# Patient Record
Sex: Female | Born: 1985 | Race: White | Hispanic: No | Marital: Married | Smoking: Former smoker
Health system: Southern US, Community
[De-identification: ages and names within clinical notes are randomized; demographics above are authoritative.]

## PROBLEM LIST (undated history)

## (undated) ENCOUNTER — Emergency Department (HOSPITAL_COMMUNITY): Payer: 59

## (undated) DIAGNOSIS — E669 Obesity, unspecified: Secondary | ICD-10-CM

## (undated) DIAGNOSIS — J45909 Unspecified asthma, uncomplicated: Secondary | ICD-10-CM

## (undated) DIAGNOSIS — O139 Gestational [pregnancy-induced] hypertension without significant proteinuria, unspecified trimester: Secondary | ICD-10-CM

## (undated) DIAGNOSIS — Z8759 Personal history of other complications of pregnancy, childbirth and the puerperium: Secondary | ICD-10-CM

## (undated) DIAGNOSIS — T4145XA Adverse effect of unspecified anesthetic, initial encounter: Secondary | ICD-10-CM

## (undated) DIAGNOSIS — T8859XA Other complications of anesthesia, initial encounter: Secondary | ICD-10-CM

## (undated) HISTORY — DX: Gestational (pregnancy-induced) hypertension without significant proteinuria, unspecified trimester: O13.9

## (undated) HISTORY — PX: BREAST REDUCTION SURGERY: SHX8

## (undated) HISTORY — DX: Personal history of other complications of pregnancy, childbirth and the puerperium: Z87.59

---
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
## 2018-02-01 NOTE — L&D Delivery Note (Addendum)
Delivery Note Pt reached complete dilation and pushed great. At 12:26 PM a viable female was delivered via Vaginal, Spontaneous (Presentation: OA ).  APGAR: 7, 8; weight 4 lb 13.1 oz (2186 g).   Placenta status: delivered spontaneously .  Cord:  with the following complications: nuchal x 1.   Anesthesia:  epidural Episiotomy: None Lacerations: 1st degree Suture Repair: 3.0 vicryl rapide Est. Blood Loss (mL):  337mL  Mom to postpartum.  Baby to Couplet care / Skin to Skin.  Plan to continue magnesium and labetalol.  Repeat labs with epidural removal.  I's and O's  Sara Wolf 08/15/2018, 1:08 PM

## 2018-03-20 LAB — OB RESULTS CONSOLE RUBELLA ANTIBODY, IGM: Rubella: NON-IMMUNE/NOT IMMUNE

## 2018-03-20 LAB — OB RESULTS CONSOLE HEPATITIS B SURFACE ANTIGEN: Hepatitis B Surface Ag: NEGATIVE

## 2018-03-20 LAB — OB RESULTS CONSOLE GC/CHLAMYDIA
Chlamydia: NEGATIVE
Gonorrhea: NEGATIVE

## 2018-03-20 LAB — OB RESULTS CONSOLE RPR: RPR: NONREACTIVE

## 2018-03-20 LAB — OB RESULTS CONSOLE HIV ANTIBODY (ROUTINE TESTING): HIV: NONREACTIVE

## 2018-06-02 ENCOUNTER — Encounter (HOSPITAL_COMMUNITY): Payer: Self-pay | Admitting: Obstetrics and Gynecology

## 2018-06-09 ENCOUNTER — Other Ambulatory Visit (HOSPITAL_COMMUNITY): Payer: Self-pay | Admitting: Obstetrics and Gynecology

## 2018-06-09 DIAGNOSIS — Z369 Encounter for antenatal screening, unspecified: Secondary | ICD-10-CM

## 2018-06-12 ENCOUNTER — Encounter (HOSPITAL_COMMUNITY): Payer: Self-pay | Admitting: *Deleted

## 2018-06-15 ENCOUNTER — Other Ambulatory Visit: Payer: Self-pay

## 2018-06-15 ENCOUNTER — Other Ambulatory Visit (HOSPITAL_COMMUNITY): Payer: Self-pay | Admitting: Obstetrics and Gynecology

## 2018-06-15 ENCOUNTER — Ambulatory Visit (HOSPITAL_COMMUNITY)
Admission: RE | Admit: 2018-06-15 | Discharge: 2018-06-15 | Disposition: A | Discharge status: Home / Self Care | Payer: 59 | Source: Ambulatory Visit | Attending: Obstetrics and Gynecology | Admitting: Obstetrics and Gynecology

## 2018-06-15 DIAGNOSIS — Z363 Encounter for antenatal screening for malformations: Secondary | ICD-10-CM

## 2018-06-15 DIAGNOSIS — O99212 Obesity complicating pregnancy, second trimester: Secondary | ICD-10-CM

## 2018-06-15 DIAGNOSIS — Z369 Encounter for antenatal screening, unspecified: Secondary | ICD-10-CM

## 2018-06-15 DIAGNOSIS — Z3A26 26 weeks gestation of pregnancy: Secondary | ICD-10-CM | POA: Diagnosis not present

## 2018-06-15 HISTORY — DX: Unspecified asthma, uncomplicated: J45.909

## 2018-06-15 HISTORY — DX: Obesity, unspecified: E66.9

## 2018-08-03 ENCOUNTER — Other Ambulatory Visit: Payer: Self-pay

## 2018-08-03 ENCOUNTER — Inpatient Hospital Stay (HOSPITAL_COMMUNITY)
Admission: AD | Admit: 2018-08-03 | Discharge: 2018-08-03 | Disposition: A | Discharge status: Home / Self Care | Payer: 59 | Attending: Obstetrics and Gynecology | Admitting: Obstetrics and Gynecology

## 2018-08-03 ENCOUNTER — Encounter (HOSPITAL_COMMUNITY): Payer: Self-pay

## 2018-08-03 DIAGNOSIS — O10013 Pre-existing essential hypertension complicating pregnancy, third trimester: Secondary | ICD-10-CM | POA: Diagnosis not present

## 2018-08-03 DIAGNOSIS — Z3A33 33 weeks gestation of pregnancy: Secondary | ICD-10-CM

## 2018-08-03 DIAGNOSIS — O163 Unspecified maternal hypertension, third trimester: Secondary | ICD-10-CM | POA: Diagnosis not present

## 2018-08-03 DIAGNOSIS — Z3689 Encounter for other specified antenatal screening: Secondary | ICD-10-CM | POA: Diagnosis not present

## 2018-08-03 DIAGNOSIS — R030 Elevated blood-pressure reading, without diagnosis of hypertension: Secondary | ICD-10-CM | POA: Diagnosis present

## 2018-08-03 LAB — PROTEIN / CREATININE RATIO, URINE
Creatinine, Urine: 200.93 mg/dL
Protein Creatinine Ratio: 0.1 mg/mg{Cre} (ref 0.00–0.15)
Total Protein, Urine: 21 mg/dL

## 2018-08-03 LAB — URINALYSIS, ROUTINE W REFLEX MICROSCOPIC
Bilirubin Urine: NEGATIVE
Glucose, UA: NEGATIVE mg/dL
Hgb urine dipstick: NEGATIVE
Ketones, ur: NEGATIVE mg/dL
Leukocytes,Ua: NEGATIVE
Nitrite: NEGATIVE
Protein, ur: NEGATIVE mg/dL
Specific Gravity, Urine: 1.023 (ref 1.005–1.030)
pH: 6 (ref 5.0–8.0)

## 2018-08-03 LAB — COMPREHENSIVE METABOLIC PANEL
ALT: 13 U/L (ref 0–44)
AST: 18 U/L (ref 15–41)
Albumin: 2.7 g/dL — ABNORMAL LOW (ref 3.5–5.0)
Alkaline Phosphatase: 84 U/L (ref 38–126)
Anion gap: 10 (ref 5–15)
BUN: 10 mg/dL (ref 6–20)
CO2: 21 mmol/L — ABNORMAL LOW (ref 22–32)
Calcium: 9.5 mg/dL (ref 8.9–10.3)
Chloride: 106 mmol/L (ref 98–111)
Creatinine, Ser: 0.77 mg/dL (ref 0.44–1.00)
GFR calc Af Amer: >60 mL/min (ref >60)
GFR calc non Af Amer: >60 mL/min (ref >60)
Glucose, Bld: 80 mg/dL (ref 70–99)
Potassium: 3.9 mmol/L (ref 3.5–5.1)
Sodium: 137 mmol/L (ref 135–145)
Total Bilirubin: 0.4 mg/dL (ref 0.3–1.2)
Total Protein: 6 g/dL — ABNORMAL LOW (ref 6.5–8.1)

## 2018-08-03 LAB — CBC
HCT: 36.6 % (ref 36.0–46.0)
Hemoglobin: 12.4 g/dL (ref 12.0–15.0)
MCH: 28.6 pg (ref 26.0–34.0)
MCHC: 33.9 g/dL (ref 30.0–36.0)
MCV: 84.5 fL (ref 80.0–100.0)
Platelets: 239 10*3/uL (ref 150–400)
RBC: 4.33 MIL/uL (ref 3.87–5.11)
RDW: 13.1 % (ref 11.5–15.5)
WBC: 8.4 10*3/uL (ref 4.0–10.5)
nRBC: 0 % (ref 0.0–0.2)

## 2018-08-03 MED ORDER — BETAMETHASONE SOD PHOS & ACET 6 (3-3) MG/ML IJ SUSP
12.0000 mg | INTRAMUSCULAR | Status: DC
  Administered 2018-08-03: 12 mg via INTRAMUSCULAR
  Filled 2018-08-03: qty 2

## 2018-08-03 NOTE — MAU Note (Signed)
Pt sent from MD office for BP evaluation.  Denies H/A, visual disturbances, or epigastric pain.  Endorses +FM.  Denies VB or LOF. 

## 2018-08-03 NOTE — MAU Provider Note (Signed)
History     CSN: [id number]  Arrival date and time: 08/03/18 1138  G1 @33 .6 wks sent from office for elevated BPs today. Denies HA, visual disturbances, RUQ pain, SOB, and CP. Her pregnancy is complicated by obesity, asthma, and possible preexisting HTN.    OB History    Gravida  1   Para      Term      Preterm      AB      Living  0     SAB      TAB      Ectopic      Multiple      Live Births              Past Medical History:  Diagnosis Date  . Asthma   . Obesity (BMI 30-39.9)     Past Surgical History:  Procedure Laterality Date  . BREAST REDUCTION SURGERY      History reviewed. No pertinent family history.  Social History   Tobacco Use  . Smoking status: Never Smoker  . Smokeless tobacco: Never Used  Substance Use Topics  . Alcohol use: Not Currently  . Drug use: Never    Allergies: No Known Allergies  No medications prior to admission.    Review of Systems  Eyes: Negative for visual disturbance.  Respiratory: Negative for shortness of breath.   Cardiovascular: Positive for leg swelling (hans and feet). Negative for chest pain.  Gastrointestinal: Negative for abdominal pain.  Genitourinary: Negative for vaginal bleeding.  Neurological: Negative for headaches.   Physical Exam   Blood pressure (!) 144/91, pulse 81, temperature 98.4 F (36.9 C), temperature source Oral, resp. rate 18, height 5\' 2"  (1.575 m), weight 108.4 kg, last menstrual period 12/02/2017, SpO2 100 %. Patient Vitals for the past 24 hrs:  BP Temp Temp src Pulse Resp SpO2 Height Weight  08/03/18 1433 (!) 144/91 98.4 F (36.9 C) Oral 81 18 100 % - -  08/03/18 1401 (!) 141/86 - - 81 - - - -  08/03/18 1346 (!) 141/87 - - 81 - - - -  08/03/18 1335 - - - - - 98 % - -  08/03/18 1331 139/79 - - 86 - - - -  08/03/18 1300 135/79 - - 86 - 99 % - -  08/03/18 1252 (!) 144/94 - - 96 - - - -  08/03/18 1222 136/84 - - 89 - - - -  08/03/18 1214 (!) 141/90 98.9 F (37.2 C)  Oral 96 16 100 % 5\' 2"  (1.575 m) 108.4 kg   Physical Exam  Nursing note and vitals reviewed. Constitutional: She is oriented to person, place, and time. She appears well-developed and well-nourished. No distress.  HENT:  Head: Normocephalic and atraumatic.  Neck: Normal range of motion.  Respiratory: Effort normal. No respiratory distress.  Musculoskeletal: Normal range of motion.        General: No edema.  Neurological: She is alert and oriented to person, place, and time.  Skin: Skin is warm and dry.  Psychiatric: She has a normal mood and affect.  EFM: 135 bpm, mod variability, + accels, no decels Toco: none  Results for orders placed or performed during the hospital encounter of 08/03/18 (from the past 24 hour(s))  Comprehensive metabolic panel     Status: Abnormal   Collection Time: 08/03/18 12:37 PM  Result Value Ref Range   Sodium 137 135 - 145 mmol/L   Potassium 3.9 3.5 - 5.1 mmol/L  Chloride 106 98 - 111 mmol/L   CO2 21 (L) 22 - 32 mmol/L   Glucose, Bld 80 70 - 99 mg/dL   BUN 10 6 - 20 mg/dL   Creatinine, Ser 0.77 0.44 - 1.00 mg/dL   Calcium 9.5 8.9 - 10.3 mg/dL   Total Protein 6.0 (L) 6.5 - 8.1 g/dL   Albumin 2.7 (L) 3.5 - 5.0 g/dL   AST 18 15 - 41 U/L   ALT 13 0 - 44 U/L   Alkaline Phosphatase 84 38 - 126 U/L   Total Bilirubin 0.4 0.3 - 1.2 mg/dL   GFR calc non Af Amer >60 >60 mL/min   GFR calc Af Amer >60 >60 mL/min   Anion gap 10 5 - 15  CBC     Status: None   Collection Time: 08/03/18 12:37 PM  Result Value Ref Range   WBC 8.4 4.0 - 10.5 K/uL   RBC 4.33 3.87 - 5.11 MIL/uL   Hemoglobin 12.4 12.0 - 15.0 g/dL   HCT 36.6 36.0 - 46.0 %   MCV 84.5 80.0 - 100.0 fL   MCH 28.6 26.0 - 34.0 pg   MCHC 33.9 30.0 - 36.0 g/dL   RDW 13.1 11.5 - 15.5 %   Platelets 239 150 - 400 K/uL   nRBC 0.0 0.0 - 0.2 %  Protein / creatinine ratio, urine     Status: None   Collection Time: 08/03/18  1:05 PM  Result Value Ref Range   Creatinine, Urine 200.93 mg/dL   Total  Protein, Urine 21 mg/dL   Protein Creatinine Ratio 0.10 0.00 - 0.15 mg/mg[Cre]  Urinalysis, Routine w reflex microscopic     Status: Abnormal   Collection Time: 08/03/18  1:05 PM  Result Value Ref Range   Color, Urine YELLOW YELLOW   APPearance HAZY (A) CLEAR   Specific Gravity, Urine 1.023 1.005 - 1.030   pH 6.0 5.0 - 8.0   Glucose, UA NEGATIVE NEGATIVE mg/dL   Hgb urine dipstick NEGATIVE NEGATIVE   Bilirubin Urine NEGATIVE NEGATIVE   Ketones, ur NEGATIVE NEGATIVE mg/dL   Protein, ur NEGATIVE NEGATIVE mg/dL   Nitrite NEGATIVE NEGATIVE   Leukocytes,Ua NEGATIVE NEGATIVE   MAU Course  Procedures  MDM Labs ordered and reviewed. Consult with Dr. Marvel Plan (per her request), no evidence of PEC or severe BPs, reviewed BPs and lab results, plan for BMZ #2 tomorrow in MAU and f/u in office next week. Stable for discharge home.  Assessment and Plan   1. [redacted] weeks gestation of pregnancy   2. NST (non-stress test) reactive   3. Hypertension affecting pregnancy in third trimester    Discharge home Follow up at [facility] next week PEC precautions  Allergies as of 08/03/2018   No Known Allergies     Medication List    TAKE these medications   Fluticasone-Salmeterol 250-50 MCG/DOSE Aepb Commonly known as: ADVAIR Inhale 1 puff into the lungs 2 (two) times daily.   prenatal multivitamin Tabs tablet Take 1 tablet by mouth daily at 12 noon.   PRESCRIPTION MEDICATION "rescue inhaler " if needed.      Julianne Handler, CNM 08/03/2018, 3:44 PM

## 2018-08-03 NOTE — Discharge Instructions (Signed)
Hypertension During Pregnancy °Hypertension is also called high blood pressure. High blood pressure means that the force of your blood moving in your body is too strong. It can cause problems for you and your baby. Different types of high blood pressure can happen during pregnancy. The types are: °· High blood pressure before you got pregnant. This is called chronic hypertension.  This can continue during your pregnancy. Your doctor will want to keep checking your blood pressure. You may need medicine to keep your blood pressure under control while you are pregnant. You will need follow-up visits after you have your baby. °· High blood pressure that goes up during pregnancy when it was normal before. This is called gestational hypertension. It will usually get better after you have your baby, but your doctor will need to watch your blood pressure to make sure that it is getting better. °· Very high blood pressure during pregnancy. This is called preeclampsia. Very high blood pressure is an emergency that needs to be checked and treated right away. °· You may develop very high blood pressure after giving birth. This is called postpartum preeclampsia. This usually occurs within 48 hours after childbirth but may occur up to 6 weeks after giving birth. This is rare. °How does this affect me? °If you have high blood pressure during pregnancy, you have a higher chance of developing high blood pressure: °· As you get older. °· If you get pregnant again. °In some cases, high blood pressure during pregnancy can cause: °· Stroke. °· Heart attack. °· Damage to the kidneys, lungs, or liver. °· Preeclampsia. °· Jerky movements you cannot control (convulsions or seizures). °· Problems with the placenta. °How does this affect my baby? °Your baby may: °· Be born early. °· Not weigh as much as he or she should. °· Not handle labor well, leading to a c-section birth. °What are the risks? °· Having high blood pressure during a past  pregnancy. °· Being overweight. °· Being 35 years old or older. °· Being pregnant for the first time. °· Being pregnant with more than one baby. °· Becoming pregnant using fertility methods, such as IVF. °· Having other problems, such as diabetes, or kidney disease. °· Having family members who have high blood pressure. °What can I do to lower my risk? ° °· Keep a healthy weight. °· Eat a healthy diet. °· Follow what your doctor tells you about treating any medical problems that you had before becoming pregnant. °It is very important to go to all of your doctor visits. Your doctor will check your blood pressure and make sure that your pregnancy is progressing as it should. Treatment should start early if a problem is found. °How is this treated? °Treatment for high blood pressure during pregnancy can differ depending on the type of high blood pressure you have and how serious it is. °· You may need to take blood pressure medicine. °· If you have been taking medicine for your blood pressure, you may need to change the medicine during pregnancy if it is not safe for your baby. °· If your doctor thinks that you could get very high blood pressure, he or she may tell you to take a low-dose aspirin during your pregnancy. °· If you have very high blood pressure, you may need to stay in the hospital so you and your baby can be watched closely. You may also need to take medicine to lower your blood pressure. This medicine may be given by mouth   or through an IV tube. °· In some cases, if your condition gets worse, you may need to have your baby early. °Follow these instructions at home: °Eating and drinking ° °· Drink enough fluid to keep your pee (urine) pale yellow. °· Avoid caffeine. °Lifestyle °· Do not use any products that contain nicotine or tobacco, such as cigarettes, e-cigarettes, and chewing tobacco. If you need help quitting, ask your doctor. °· Do not use alcohol or drugs. °· Avoid stress. °· Rest and get plenty  of sleep. °· Regular exercise can help. Ask your doctor what kinds of exercise are best for you. °General instructions °· Take over-the-counter and prescription medicines only as told by your doctor. °· Keep all prenatal and follow-up visits as told by your doctor. This is important. °Contact a doctor if: °· You have symptoms that your doctor told you to watch for, such as: °? Headaches. °? Nausea. °? Vomiting. °? Belly (abdominal) pain. °? Dizziness. °? Light-headedness. °Get help right away if: °· You have: °? Very bad belly pain that does not get better with treatment. °? A very bad headache that does not get better. °? Vomiting that does not get better. °? Sudden, fast weight gain. °? Sudden swelling in your hands, ankles, or face. °? Bleeding from your vagina. °? Blood in your pee. °? Blurry vision. °? Double vision. °? Shortness of breath. °? Chest pain. °? Weakness on one side of your body. °? Trouble talking. °· Your baby is not moving as much as usual. °Summary °· High blood pressure is also called hypertension. °· High blood pressure means that the force of your blood moving in your body is too strong. °· High blood pressure can cause problems for you and your baby. °· Keep all follow-up visits as told by your doctor. This is important. °This information is not intended to replace advice given to you by your health care provider. Make sure you discuss any questions you have with your health care provider. °Document Released: 02/20/2010 Document Revised: 05/11/2018 Document Reviewed: 02/14/2018 °Elsevier Patient Education © 2020 [employer] ° °

## 2018-08-04 ENCOUNTER — Inpatient Hospital Stay (HOSPITAL_COMMUNITY)
Admission: AD | Admit: 2018-08-04 | Discharge: 2018-08-04 | Disposition: A | Discharge status: Home / Self Care | Payer: 59 | Attending: Obstetrics and Gynecology | Admitting: Obstetrics and Gynecology

## 2018-08-04 ENCOUNTER — Other Ambulatory Visit: Payer: Self-pay

## 2018-08-04 DIAGNOSIS — Z3A33 33 weeks gestation of pregnancy: Secondary | ICD-10-CM | POA: Diagnosis not present

## 2018-08-04 DIAGNOSIS — Z3A34 34 weeks gestation of pregnancy: Secondary | ICD-10-CM

## 2018-08-04 DIAGNOSIS — O133 Gestational [pregnancy-induced] hypertension without significant proteinuria, third trimester: Secondary | ICD-10-CM

## 2018-08-04 DIAGNOSIS — O10013 Pre-existing essential hypertension complicating pregnancy, third trimester: Secondary | ICD-10-CM | POA: Insufficient documentation

## 2018-08-04 MED ORDER — BETAMETHASONE SOD PHOS & ACET 6 (3-3) MG/ML IJ SUSP
12.0000 mg | Freq: Once | INTRAMUSCULAR | Status: AC
  Administered 2018-08-04: 12 mg via INTRAMUSCULAR
  Filled 2018-08-04: qty 2

## 2018-08-04 NOTE — MAU Note (Signed)
Here for 2nd dose of Betamethasone.  No problems with first.  No complaints.

## 2018-08-05 ENCOUNTER — Encounter (HOSPITAL_COMMUNITY): Payer: Self-pay

## 2018-08-05 ENCOUNTER — Other Ambulatory Visit: Payer: Self-pay

## 2018-08-05 ENCOUNTER — Inpatient Hospital Stay (HOSPITAL_COMMUNITY)
Admission: AD | Admit: 2018-08-05 | Discharge: 2018-08-06 | Disposition: A | Discharge status: Home / Self Care | Payer: 59 | Attending: Obstetrics and Gynecology | Admitting: Obstetrics and Gynecology

## 2018-08-05 DIAGNOSIS — Z3689 Encounter for other specified antenatal screening: Secondary | ICD-10-CM

## 2018-08-05 DIAGNOSIS — O368130 Decreased fetal movements, third trimester, not applicable or unspecified: Secondary | ICD-10-CM

## 2018-08-05 DIAGNOSIS — Z3A34 34 weeks gestation of pregnancy: Secondary | ICD-10-CM | POA: Diagnosis not present

## 2018-08-05 DIAGNOSIS — O133 Gestational [pregnancy-induced] hypertension without significant proteinuria, third trimester: Secondary | ICD-10-CM | POA: Diagnosis not present

## 2018-08-05 LAB — COMPREHENSIVE METABOLIC PANEL
ALT: 16 U/L (ref 0–44)
AST: 21 U/L (ref 15–41)
Albumin: 2.6 g/dL — ABNORMAL LOW (ref 3.5–5.0)
Alkaline Phosphatase: 76 U/L (ref 38–126)
Anion gap: 8 (ref 5–15)
BUN: 18 mg/dL (ref 6–20)
CO2: 20 mmol/L — ABNORMAL LOW (ref 22–32)
Calcium: 9.3 mg/dL (ref 8.9–10.3)
Chloride: 110 mmol/L (ref 98–111)
Creatinine, Ser: 0.74 mg/dL (ref 0.44–1.00)
GFR calc Af Amer: >60 mL/min (ref >60)
GFR calc non Af Amer: >60 mL/min (ref >60)
Glucose, Bld: 110 mg/dL — ABNORMAL HIGH (ref 70–99)
Potassium: 3.8 mmol/L (ref 3.5–5.1)
Sodium: 138 mmol/L (ref 135–145)
Total Bilirubin: 0.5 mg/dL (ref 0.3–1.2)
Total Protein: 5.6 g/dL — ABNORMAL LOW (ref 6.5–8.1)

## 2018-08-05 LAB — URINALYSIS, ROUTINE W REFLEX MICROSCOPIC
Bilirubin Urine: NEGATIVE
Glucose, UA: NEGATIVE mg/dL
Hgb urine dipstick: NEGATIVE
Ketones, ur: NEGATIVE mg/dL
Nitrite: NEGATIVE
Protein, ur: 30 mg/dL — AB
Specific Gravity, Urine: 1.021 (ref 1.005–1.030)
pH: 6 (ref 5.0–8.0)

## 2018-08-05 LAB — CBC
HCT: 32.8 % — ABNORMAL LOW (ref 36.0–46.0)
Hemoglobin: 11 g/dL — ABNORMAL LOW (ref 12.0–15.0)
MCH: 29.2 pg (ref 26.0–34.0)
MCHC: 33.5 g/dL (ref 30.0–36.0)
MCV: 87 fL (ref 80.0–100.0)
Platelets: 222 10*3/uL (ref 150–400)
RBC: 3.77 MIL/uL — ABNORMAL LOW (ref 3.87–5.11)
RDW: 13.3 % (ref 11.5–15.5)
WBC: 11.4 10*3/uL — ABNORMAL HIGH (ref 4.0–10.5)
nRBC: 0.2 % (ref 0.0–0.2)

## 2018-08-05 NOTE — MAU Note (Signed)
Patient presents to MAU c/o of DFM.  Patient reports she "hasn't felt baby move much through the day today even after doing kick counts for 2 hours."  Patient stated she felt 5 movements in 2 hours.  Denies vaginal bleeding and LOF.

## 2018-08-05 NOTE — MAU Provider Note (Signed)
Chief Complaint:  Decreased Fetal Movement   First Provider Initiated Contact with Patient 08/05/18 2305      HPI: Sara Wolf is a 33 y.o. G1P0 at 3435w1d who presents to maternity admissions reporting decreased fetal movement today.  She reports she hasn't felt much movement tonight so she did kick counting x 2 hours and felt 5-6 movements.  She was recently diagnosed with GHTN and had labwork 08/03/18.  She denies h/a, visual disturbances, or epigastric pain, but does report some mild intermittent dull pain in her RUQ recently.  She denies this pain currently in MAU. She reports no increase in fetal movements while in MAU reporting they are rare.  There are no other symptoms. She has not tried any other treatments.   HPI  Past Medical History: Past Medical History:  Diagnosis Date  . Asthma   . Obesity (BMI 30-39.9)     Past obstetric history: OB History  Gravida Para Term Preterm AB Living  1         0  SAB TAB Ectopic Multiple Live Births               # Outcome Date GA Lbr Len/2nd Weight Sex Delivery Anes PTL Lv  1 Current             Past Surgical History: Past Surgical History:  Procedure Laterality Date  . BREAST REDUCTION SURGERY      Family History: History reviewed. No pertinent family history.  Social History: Social History   Tobacco Use  . Smoking status: Never Smoker  . Smokeless tobacco: Never Used  Substance Use Topics  . Alcohol use: Not Currently  . Drug use: Never    Allergies: No Known Allergies  Meds:  No medications prior to admission.    ROS:  Review of Systems  Constitutional: Negative for chills, fatigue and fever.  Eyes: Negative for visual disturbance.  Respiratory: Negative for shortness of breath.   Cardiovascular: Negative for chest pain.  Gastrointestinal: Positive for abdominal pain. Negative for nausea and vomiting.  Genitourinary: Negative for difficulty urinating, dysuria, flank pain, pelvic pain, vaginal bleeding,  vaginal discharge and vaginal pain.  Neurological: Negative for dizziness and headaches.  Psychiatric/Behavioral: Negative.      I have reviewed patient's Past Medical Hx, Surgical Hx, Family Hx, Social Hx, medications and allergies.   Physical Exam   Patient Vitals for the past 24 hrs:  BP Temp Temp src Pulse Resp SpO2 Height Weight  08/06/18 0219 (!) 141/75 98.4 F (36.9 C) Oral 69 19 100 % - -  08/06/18 0116 138/73 - - 71 18 - - -  08/06/18 0101 138/74 - - 68 18 - - -  08/06/18 0057 131/67 - - (!) 57 18 - - -  08/05/18 2218 (!) 148/74 - - 72 - - - -  08/05/18 2201 (!) 149/81 98.9 F (37.2 C) Oral 72 18 100 % 5\' 2"  (1.575 m) 108.8 kg   Constitutional: Well-developed, well-nourished female in no acute distress.  Cardiovascular: normal rate Respiratory: normal effort GI: Abd soft, non-tender, gravid appropriate for gestational age.  MS: Extremities nontender, no edema, normal ROM Neurologic: Alert and oriented x 4.  GU: Neg CVAT.      FHT:  Baseline 135 , moderate variability, accelerations present, mostly 10 x 10 with occasional 15 x 15, no decelerations Contractions: none on toco or to palpation   Labs: Results for orders placed or performed during the hospital encounter of 08/05/18 (from the  past 24 hour(s))  Protein / creatinine ratio, urine     Status: Abnormal   Collection Time: 08/05/18 10:49 PM  Result Value Ref Range   Creatinine, Urine 139.14 mg/dL   Total Protein, Urine 37 mg/dL   Protein Creatinine Ratio 0.27 (H) 0.00 - 0.15 mg/mg[Cre]  Urinalysis, Routine w reflex microscopic     Status: Abnormal   Collection Time: 08/05/18 10:49 PM  Result Value Ref Range   Color, Urine YELLOW YELLOW   APPearance HAZY (A) CLEAR   Specific Gravity, Urine 1.021 1.005 - 1.030   pH 6.0 5.0 - 8.0   Glucose, UA NEGATIVE NEGATIVE mg/dL   Hgb urine dipstick NEGATIVE NEGATIVE   Bilirubin Urine NEGATIVE NEGATIVE   Ketones, ur NEGATIVE NEGATIVE mg/dL   Protein, ur 30 (A)  NEGATIVE mg/dL   Nitrite NEGATIVE NEGATIVE   Leukocytes,Ua TRACE (A) NEGATIVE   RBC / HPF 0-5 0 - 5 RBC/hpf   WBC, UA 0-5 0 - 5 WBC/hpf   Bacteria, UA RARE (A) NONE SEEN   Squamous Epithelial / LPF 11-20 0 - 5   Mucus PRESENT   CBC     Status: Abnormal   Collection Time: 08/05/18 10:59 PM  Result Value Ref Range   WBC 11.4 (H) 4.0 - 10.5 K/uL   RBC 3.77 (L) 3.87 - 5.11 MIL/uL   Hemoglobin 11.0 (L) 12.0 - 15.0 g/dL   HCT 16.132.8 (L) 09.636.0 - 04.546.0 %   MCV 87.0 80.0 - 100.0 fL   MCH 29.2 26.0 - 34.0 pg   MCHC 33.5 30.0 - 36.0 g/dL   RDW 40.913.3 81.111.5 - 91.415.5 %   Platelets 222 150 - 400 K/uL   nRBC 0.2 0.0 - 0.2 %  Comprehensive metabolic panel     Status: Abnormal   Collection Time: 08/05/18 10:59 PM  Result Value Ref Range   Sodium 138 135 - 145 mmol/L   Potassium 3.8 3.5 - 5.1 mmol/L   Chloride 110 98 - 111 mmol/L   CO2 20 (L) 22 - 32 mmol/L   Glucose, Bld 110 (H) 70 - 99 mg/dL   BUN 18 6 - 20 mg/dL   Creatinine, Ser 7.820.74 0.44 - 1.00 mg/dL   Calcium 9.3 8.9 - 95.610.3 mg/dL   Total Protein 5.6 (L) 6.5 - 8.1 g/dL   Albumin 2.6 (L) 3.5 - 5.0 g/dL   AST 21 15 - 41 U/L   ALT 16 0 - 44 U/L   Alkaline Phosphatase 76 38 - 126 U/L   Total Bilirubin 0.5 0.3 - 1.2 mg/dL   GFR calc non Af Amer >60 >60 mL/min   GFR calc Af Amer >60 >60 mL/min   Anion gap 8 5 - 15      Imaging:  No results found.  MAU Course/MDM: Orders Placed This Encounter  Procedures  . US MFM Fetal BPP Wo Non Stress  . CBC  . Comprehensive metabolic panel  . Protein / creatinine ratio, urine  . Urinalysis, Routine w reflex microscopic  . EKG 12-Lead  . Discharge patient    No orders of the defined types were placed in this encounter.    NST reviewed and reactive but limited 15 x 15's on initial tracing and pt without improvement in fetal movement so BPP ordered.  Blood pressure c/w pressures on 08/03/18 and PEC labs all wnl.  No s/sx of PEC.  FHR tracing more reactive after US so pt with 10/10 with reactive NST  plus normal BPP result.  Pt is feeling fetal movement but smaller movements and no real kicks.  Discussed fetal movement counting with pt, pt to stay hydrated, eat regularly, keep appt on 08/07/18 with Dr Marvel Plan.     Pt discharge with strict return precautions.    Assessment: 1. Gestational hypertension, third trimester   2. Decreased fetal movements in third trimester, single or unspecified fetus   3. NST (non-stress test) reactive     Plan: Discharge home Labor precautions and fetal kick counts Follow-up Information    Meisinger, Sherren Mocha, MD Follow up.   Specialty: Obstetrics and Gynecology Why: Or return to MAU as needed for emergencies. Contact information: [address], [address] [place] [address] [phone]          Allergies as of 08/06/2018   No Known Allergies     Medication List    TAKE these medications   Fluticasone-Salmeterol 250-50 MCG/DOSE Aepb Commonly known as: ADVAIR Inhale 1 puff into the lungs 2 (two) times daily.   prenatal multivitamin Tabs tablet Take 1 tablet by mouth daily at 12 noon.   PRESCRIPTION MEDICATION "rescue inhaler " if needed.       Fatima Blank Certified Nurse-Midwife 08/06/2018 3:42 AM

## 2018-08-06 ENCOUNTER — Inpatient Hospital Stay (HOSPITAL_BASED_OUTPATIENT_CLINIC_OR_DEPARTMENT_OTHER): Payer: 59

## 2018-08-06 DIAGNOSIS — O368130 Decreased fetal movements, third trimester, not applicable or unspecified: Secondary | ICD-10-CM | POA: Diagnosis not present

## 2018-08-06 DIAGNOSIS — Z3A34 34 weeks gestation of pregnancy: Secondary | ICD-10-CM | POA: Diagnosis not present

## 2018-08-06 LAB — PROTEIN / CREATININE RATIO, URINE
Creatinine, Urine: 139.14 mg/dL
Protein Creatinine Ratio: 0.27 mg/mg{Cre} — ABNORMAL HIGH (ref 0.00–0.15)
Total Protein, Urine: 37 mg/dL

## 2018-08-09 NOTE — Progress Notes (Signed)
She presented for injection only, this was not a provider visit.

## 2018-08-14 ENCOUNTER — Encounter (HOSPITAL_COMMUNITY): Payer: Self-pay | Admitting: *Deleted

## 2018-08-14 ENCOUNTER — Inpatient Hospital Stay (HOSPITAL_COMMUNITY)
Admission: AD | Admit: 2018-08-14 | Discharge: 2018-08-17 | DRG: 807 | Disposition: A | Discharge status: Home / Self Care | Payer: 59 | Attending: Obstetrics and Gynecology | Admitting: Obstetrics and Gynecology

## 2018-08-14 ENCOUNTER — Other Ambulatory Visit: Payer: Self-pay

## 2018-08-14 DIAGNOSIS — O99214 Obesity complicating childbirth: Secondary | ICD-10-CM | POA: Diagnosis present

## 2018-08-14 DIAGNOSIS — Z3A35 35 weeks gestation of pregnancy: Secondary | ICD-10-CM

## 2018-08-14 DIAGNOSIS — O9952 Diseases of the respiratory system complicating childbirth: Secondary | ICD-10-CM | POA: Diagnosis present

## 2018-08-14 DIAGNOSIS — O1410 Severe pre-eclampsia, unspecified trimester: Secondary | ICD-10-CM | POA: Diagnosis present

## 2018-08-14 DIAGNOSIS — Z1159 Encounter for screening for other viral diseases: Secondary | ICD-10-CM

## 2018-08-14 DIAGNOSIS — O1414 Severe pre-eclampsia complicating childbirth: Secondary | ICD-10-CM | POA: Diagnosis present

## 2018-08-14 DIAGNOSIS — J45909 Unspecified asthma, uncomplicated: Secondary | ICD-10-CM | POA: Diagnosis present

## 2018-08-14 DIAGNOSIS — O1413 Severe pre-eclampsia, third trimester: Secondary | ICD-10-CM | POA: Diagnosis present

## 2018-08-14 HISTORY — DX: Other complications of anesthesia, initial encounter: T88.59XA

## 2018-08-14 LAB — COMPREHENSIVE METABOLIC PANEL
ALT: 14 U/L (ref 0–44)
AST: 20 U/L (ref 15–41)
Albumin: 2.4 g/dL — ABNORMAL LOW (ref 3.5–5.0)
Alkaline Phosphatase: 92 U/L (ref 38–126)
Anion gap: 8 (ref 5–15)
BUN: 19 mg/dL (ref 6–20)
CO2: 21 mmol/L — ABNORMAL LOW (ref 22–32)
Calcium: 9.1 mg/dL (ref 8.9–10.3)
Chloride: 107 mmol/L (ref 98–111)
Creatinine, Ser: 1.06 mg/dL — ABNORMAL HIGH (ref 0.44–1.00)
GFR calc Af Amer: >60 mL/min (ref >60)
GFR calc non Af Amer: >60 mL/min (ref >60)
Glucose, Bld: 105 mg/dL — ABNORMAL HIGH (ref 70–99)
Potassium: 4.2 mmol/L (ref 3.5–5.1)
Sodium: 136 mmol/L (ref 135–145)
Total Bilirubin: 0.3 mg/dL (ref 0.3–1.2)
Total Protein: 5.3 g/dL — ABNORMAL LOW (ref 6.5–8.1)

## 2018-08-14 LAB — TYPE AND SCREEN
ABO/RH(D): B POS
Antibody Screen: NEGATIVE

## 2018-08-14 LAB — URINALYSIS, ROUTINE W REFLEX MICROSCOPIC
Bilirubin Urine: NEGATIVE
Glucose, UA: NEGATIVE mg/dL
Hgb urine dipstick: NEGATIVE
Ketones, ur: NEGATIVE mg/dL
Leukocytes,Ua: NEGATIVE
Nitrite: NEGATIVE
Protein, ur: >=300 mg/dL — AB
Specific Gravity, Urine: 1.032 — ABNORMAL HIGH (ref 1.005–1.030)
pH: 6 (ref 5.0–8.0)

## 2018-08-14 LAB — CBC
HCT: 36.5 % (ref 36.0–46.0)
Hemoglobin: 12.5 g/dL (ref 12.0–15.0)
MCH: 29 pg (ref 26.0–34.0)
MCHC: 34.2 g/dL (ref 30.0–36.0)
MCV: 84.7 fL (ref 80.0–100.0)
Platelets: 223 10*3/uL (ref 150–400)
RBC: 4.31 MIL/uL (ref 3.87–5.11)
RDW: 13.1 % (ref 11.5–15.5)
WBC: 10.8 10*3/uL — ABNORMAL HIGH (ref 4.0–10.5)
nRBC: 0 % (ref 0.0–0.2)

## 2018-08-14 LAB — PROTEIN / CREATININE RATIO, URINE
Creatinine, Urine: 283.39 mg/dL
Protein Creatinine Ratio: 3.98 mg/mg{Cre} — ABNORMAL HIGH (ref 0.00–0.15)
Total Protein, Urine: 1128 mg/dL

## 2018-08-14 LAB — GROUP B STREP BY PCR: Group B strep by PCR: NEGATIVE

## 2018-08-14 LAB — SARS CORONAVIRUS 2 BY RT PCR (HOSPITAL ORDER, PERFORMED IN ~~LOC~~ HOSPITAL LAB): SARS Coronavirus 2: NEGATIVE

## 2018-08-14 MED ORDER — LACTATED RINGERS IV SOLN
INTRAVENOUS | Status: DC
  Administered 2018-08-14 – 2018-08-15 (×2): via INTRAVENOUS

## 2018-08-14 MED ORDER — MISOPROSTOL 25 MCG QUARTER TABLET
25.0000 ug | ORAL_TABLET | ORAL | Status: DC | PRN
  Administered 2018-08-14: 25 ug via VAGINAL
  Filled 2018-08-14 (×2): qty 1

## 2018-08-14 MED ORDER — SOD CITRATE-CITRIC ACID 500-334 MG/5ML PO SOLN
30.0000 mL | ORAL | Status: DC | PRN
  Administered 2018-08-14: 30 mL via ORAL
  Filled 2018-08-14: qty 30

## 2018-08-14 MED ORDER — MAGNESIUM SULFATE 40 G IN LACTATED RINGERS - SIMPLE
2.0000 g/h | INTRAVENOUS | Status: DC
  Administered 2018-08-14 – 2018-08-16 (×3): 2 g/h via INTRAVENOUS
  Filled 2018-08-14 (×2): qty 500

## 2018-08-14 MED ORDER — OXYCODONE-ACETAMINOPHEN 5-325 MG PO TABS: 1.0000 | ORAL_TABLET | ORAL | Status: DC | PRN

## 2018-08-14 MED ORDER — LABETALOL HCL 5 MG/ML IV SOLN
80.0000 mg | INTRAVENOUS | Status: DC | PRN
  Administered 2018-08-14: 80 mg via INTRAVENOUS
  Filled 2018-08-14: qty 16

## 2018-08-14 MED ORDER — HYDRALAZINE HCL 20 MG/ML IJ SOLN: 10.0000 mg | INTRAMUSCULAR | Status: DC | PRN

## 2018-08-14 MED ORDER — LABETALOL HCL 5 MG/ML IV SOLN
20.0000 mg | INTRAVENOUS | Status: DC | PRN
  Administered 2018-08-14: 20 mg via INTRAVENOUS
  Filled 2018-08-14: qty 4

## 2018-08-14 MED ORDER — OXYTOCIN 40 UNITS IN NORMAL SALINE INFUSION - SIMPLE MED
2.5000 [IU]/h | INTRAVENOUS | Status: DC
  Administered 2018-08-15: 2.5 [IU]/h via INTRAVENOUS
  Filled 2018-08-14: qty 1000

## 2018-08-14 MED ORDER — MAGNESIUM SULFATE BOLUS VIA INFUSION
4.0000 g | Freq: Once | INTRAVENOUS | Status: AC
  Administered 2018-08-14: 4 g via INTRAVENOUS
  Filled 2018-08-14: qty 500

## 2018-08-14 MED ORDER — OXYTOCIN BOLUS FROM INFUSION
500.0000 mL | Freq: Once | INTRAVENOUS | Status: AC
  Administered 2018-08-15: 500 mL via INTRAVENOUS

## 2018-08-14 MED ORDER — LACTATED RINGERS IV SOLN: 500.0000 mL | INTRAVENOUS | Status: DC | PRN

## 2018-08-14 MED ORDER — ACETAMINOPHEN 325 MG PO TABS
650.0000 mg | ORAL_TABLET | ORAL | Status: DC | PRN
  Administered 2018-08-14: 650 mg via ORAL
  Filled 2018-08-14: qty 2

## 2018-08-14 MED ORDER — LABETALOL HCL 200 MG PO TABS
200.0000 mg | ORAL_TABLET | Freq: Once | ORAL | Status: AC
  Administered 2018-08-14: 200 mg via ORAL
  Filled 2018-08-14: qty 1

## 2018-08-14 MED ORDER — LIDOCAINE HCL (PF) 1 % IJ SOLN
30.0000 mL | INTRAMUSCULAR | Status: AC | PRN
  Administered 2018-08-15: 30 mL via SUBCUTANEOUS
  Filled 2018-08-14: qty 30

## 2018-08-14 MED ORDER — LABETALOL HCL 5 MG/ML IV SOLN
40.0000 mg | INTRAVENOUS | Status: DC | PRN
  Administered 2018-08-14: 40 mg via INTRAVENOUS
  Filled 2018-08-14: qty 8

## 2018-08-14 MED ORDER — TERBUTALINE SULFATE 1 MG/ML IJ SOLN: 0.2500 mg | Freq: Once | INTRAMUSCULAR | Status: DC | PRN

## 2018-08-14 MED ORDER — LACTATED RINGERS IV SOLN: INTRAVENOUS | Status: DC

## 2018-08-14 MED ORDER — OXYCODONE-ACETAMINOPHEN 5-325 MG PO TABS: 2.0000 | ORAL_TABLET | ORAL | Status: DC | PRN

## 2018-08-14 MED ORDER — ONDANSETRON HCL 4 MG/2ML IJ SOLN: 4.0000 mg | Freq: Four times a day (QID) | INTRAMUSCULAR | Status: DC | PRN

## 2018-08-14 NOTE — MAU Provider Note (Signed)
History     CSN: [id number]  Arrival date and time: 08/14/18 1433   First Provider Initiated Contact with Patient 08/14/18 1537      Chief Complaint  Patient presents with  . BP Evaluation   HPI   [name] is a 33 y.o. female G1P0 @ [redacted]w[redacted]d here in MAU sent from the office d/t elevated BP. She reports that her blood pressure has been elevated off and on since the second trimester. She does not recall having elevated BP prior to the 2nd trimester. She has no symptoms of high BP. No scotoma, no HA.   OB History    Gravida  1   Para      Term      Preterm      AB      Living  0     SAB      TAB      Ectopic      Multiple      Live Births              Past Medical History:  Diagnosis Date  . Asthma   . Obesity (BMI 30-39.9)     Past Surgical History:  Procedure Laterality Date  . BREAST REDUCTION SURGERY      Family History  Problem Relation Age of Onset  . Healthy Mother   . Hypertension Father     Social History   Tobacco Use  . Smoking status: Never Smoker  . Smokeless tobacco: Never Used  Substance Use Topics  . Alcohol use: Not Currently  . Drug use: Never    Allergies: No Known Allergies  Medications Prior to Admission  Medication Sig Dispense Refill Last Dose  . Fluticasone-Salmeterol (ADVAIR) 250-50 MCG/DOSE AEPB Inhale 1 puff into the lungs 2 (two) times daily.   08/13/2018 at 0900  . Prenatal Vit-Fe Fumarate-FA (PRENATAL MULTIVITAMIN) TABS tablet Take 1 tablet by mouth daily at 12 noon.   08/13/2018 at 1700  . PRESCRIPTION MEDICATION "rescue inhaler " if needed.   Past Month at Unknown time   Results for orders placed or performed during the hospital encounter of 08/14/18 (from the past 48 hour(s))  Protein / creatinine ratio, urine     Status: Abnormal   Collection Time: 08/14/18  2:00 PM  Result Value Ref Range   Creatinine, Urine 283.39 mg/dL   Total Protein, Urine 1,128 mg/dL    Comment: RESULTS CONFIRMED BY  MANUAL DILUTION   Protein Creatinine Ratio 3.98 (H) 0.00 - 0.15 mg/mg[Cre]    Comment: Performed at [facility] [address], [place], [place] [address]  Urinalysis, Routine w reflex microscopic     Status: Abnormal   Collection Time: 08/14/18  2:01 PM  Result Value Ref Range   Color, Urine YELLOW YELLOW   APPearance CLOUDY (A) CLEAR   Specific Gravity, Urine 1.032 (H) 1.005 - 1.030   pH 6.0 5.0 - 8.0   Glucose, UA NEGATIVE NEGATIVE mg/dL   Hgb urine dipstick NEGATIVE NEGATIVE   Bilirubin Urine NEGATIVE NEGATIVE   Ketones, ur NEGATIVE NEGATIVE mg/dL   Protein, ur >=300 (A) NEGATIVE mg/dL   Nitrite NEGATIVE NEGATIVE   Leukocytes,Ua NEGATIVE NEGATIVE   RBC / HPF 0-5 0 - 5 RBC/hpf   WBC, UA 0-5 0 - 5 WBC/hpf   Bacteria, UA RARE (A) NONE SEEN   Squamous Epithelial / LPF 6-10 0 - 5   Mucus PRESENT     Comment: Performed at [facility]  [facility], [address] [place], [place] [address]  CBC     Status: Abnormal   Collection Time: 08/14/18  3:14 PM  Result Value Ref Range   WBC 10.8 (H) 4.0 - 10.5 K/uL   RBC 4.31 3.87 - 5.11 MIL/uL   Hemoglobin 12.5 12.0 - 15.0 g/dL   HCT 60.436.5 54.036.0 - 98.146.0 %   MCV 84.7 80.0 - 100.0 fL   MCH 29.0 26.0 - 34.0 pg   MCHC 34.2 30.0 - 36.0 g/dL   RDW 19.113.1 47.811.5 - 29.515.5 %   Platelets 223 150 - 400 K/uL   nRBC 0.0 0.0 - 0.2 %    Comment: Performed at [facility], [address] [place], [place] [address]  Comprehensive metabolic panel     Status: Abnormal   Collection Time: 08/14/18  3:14 PM  Result Value Ref Range   Sodium 136 135 - 145 mmol/L   Potassium 4.2 3.5 - 5.1 mmol/L   Chloride 107 98 - 111 mmol/L   CO2 21 (L) 22 - 32 mmol/L   Glucose, Bld 105 (H) 70 - 99 mg/dL   BUN 19 6 - 20 mg/dL   Creatinine, Ser 8.651.06 (H) 0.44 - 1.00 mg/dL   Calcium 9.1 8.9 - 78.410.3 mg/dL   Total Protein 5.3 (L) 6.5 - 8.1 g/dL   Albumin 2.4 (L) 3.5 - 5.0 g/dL   AST 20 15 - 41 U/L   ALT 14 0 - 44 U/L   Alkaline Phosphatase 92 38 - 126 U/L   Total  Bilirubin 0.3 0.3 - 1.2 mg/dL   GFR calc non Af Amer >60 >60 mL/min   GFR calc Af Amer >60 >60 mL/min   Anion gap 8 5 - 15    Comment: Performed at [facility], [address] [place], [place] [address]  Type and screen [facility]     Status: None   Collection Time: 08/14/18  3:35 PM  Result Value Ref Range   ABO/RH(D) B POS    Antibody Screen NEG    Sample Expiration      08/17/2018,2359 Performed at [facility], [address] [place], [place] [address]   ABO/Rh     Status: None   Collection Time: 08/14/18  3:35 PM  Result Value Ref Range   ABO/RH(D)      B POS Performed at [facility], [address] [place], [place] [address]   Group B strep by PCR     Status: None   Collection Time: 08/14/18  5:05 PM   Specimen: Vaginal/Rectal; Genital  Result Value Ref Range   Group B strep by PCR NEGATIVE NEGATIVE    Comment: (NOTE) Intrapartum testing with Xpert GBS assay should be used as an adjunct to other methods available and not used to replace antepartum testing (at 35-[redacted] weeks gestation). Performed at [facility], [address] [place], [place] [address]   SARS Coronavirus 2 (CEPHEID - Performed in [facility] hospital lab), Hosp Order     Status: None   Collection Time: 08/14/18  5:24 PM   Specimen: Nasopharyngeal Swab  Result Value Ref Range   SARS Coronavirus 2 NEGATIVE NEGATIVE    Comment: (NOTE) If result is NEGATIVE SARS-CoV-2 target nucleic acids are NOT DETECTED. The SARS-CoV-2 RNA is generally detectable in upper and lower  respiratory specimens during the acute phase of infection. The lowest  concentration of SARS-CoV-2 viral copies this assay  can detect is 250  copies / mL. A negative result does not preclude SARS-CoV-2 infection  and should not be used as the sole basis for treatment or other  patient management decisions.  A negative result may occur with  improper specimen collection / handling, submission  of specimen other  than nasopharyngeal swab, presence of viral mutation(s) within the  areas targeted by this assay, and inadequate number of viral copies  (<250 copies / mL). A negative result must be combined with clinical  observations, patient history, and epidemiological information. If result is POSITIVE SARS-CoV-2 target nucleic acids are DETECTED. The SARS-CoV-2 RNA is generally detectable in upper and lower  respiratory specimens dur ing the acute phase of infection.  Positive  results are indicative of active infection with SARS-CoV-2.  Clinical  correlation with patient history and other diagnostic information is  necessary to determine patient infection status.  Positive results do  not rule out bacterial infection or co-infection with other viruses. If result is PRESUMPTIVE POSTIVE SARS-CoV-2 nucleic acids MAY BE PRESENT.   A presumptive positive result was obtained on the submitted specimen  and confirmed on repeat testing.  While 2019 novel coronavirus  (SARS-CoV-2) nucleic acids may be present in the submitted sample  additional confirmatory testing may be necessary for epidemiological  and / or clinical management purposes  to differentiate between  SARS-CoV-2 and other Sarbecovirus currently known to infect humans.  If clinically indicated additional testing with an alternate test  methodology [id number]) is advised. The SARS-CoV-2 RNA is generally  detectable in upper and lower respiratory sp ecimens during the acute  phase of infection. The expected result is Negative. Fact Sheet for Patients:  BoilerBrush.com.cyhttps://www.fda.gov/media/136312/download Fact Sheet for Healthcare Providers: https://pope.com/https://www.fda.gov/media/136313/download This test is not yet approved or cleared by the [place] FDA and has been authorized for detection and/or diagnosis of SARS-CoV-2 by FDA under an Emergency Use Authorization (EUA).  This EUA will remain in effect (meaning this test can be used) for  the duration of the COVID-19 declaration under Section 564(b)(1) of the Act, 21 U.S.C. section 360bbb-3(b)(1), unless the authorization is terminated or revoked sooner. Performed at [facility], [address] [place], [place] [address]    Review of Systems  Constitutional: Negative for fever.  Eyes: Negative for photophobia and visual disturbance.  Gastrointestinal: Negative for abdominal pain.  Neurological: Negative for headaches.   Physical Exam   Blood pressure (!) 150/85, pulse 87, temperature 98.6 F (37 C), temperature source Oral, resp. rate 20, height 5\' 2"  (1.575 m), weight 111.2 kg, last menstrual period 12/02/2017, SpO2 99 %.   Patient Vitals for the past 24 hrs:  BP Temp Temp src Pulse Resp SpO2 Height Weight  08/14/18 1829 (!) 141/92 - - 93 16 - - -  08/14/18 1810 (!) 155/96 - - 92 - - - -  08/14/18 1757 (!) 157/103 - - 93 - - - -  08/14/18 1749 (!) 173/103 98 F (36.7 C) Oral - 16 - 5\' 2"  (1.575 m) 111.2 kg  08/14/18 1745 (!) 173/103 - - 90 - - - -  08/14/18 1741 (!) 176/96 - - 94 - - - -  08/14/18 1645 (!) 156/97 - - 90 - - - -  08/14/18 1630 (!) 153/94 - - 81 - - - -  08/14/18 1616 (!) 154/96 - - 91 - - - -  08/14/18 1600 (!) 136/93 - - 88 - - - -  08/14/18 1545 (!) 150/85 - -  87 - - - -  08/14/18 1530 (!) 157/102 - - 98 - - - -  08/14/18 1516 (!) 157/92 - - 97 - - - -  08/14/18 1510 (!) 166/97 - - 95 - - - -  08/14/18 1453 (!) 164/105 98.6 F (37 C) Oral 72 20 99 % - -  08/14/18 1443 - - - - - - 5\' 2"  (1.575 m) 111.2 kg    Physical Exam  Constitutional: She is oriented to person, place, and time. She appears well-developed and well-nourished. No distress.  HENT:  Head: Normocephalic.  Eyes: Pupils are equal, round, and reactive to light.  GI: Soft. She exhibits no distension. There is no abdominal tenderness. There is no rebound.  Musculoskeletal: Normal range of motion.  Neurological: She is alert and oriented to person, place, and time. She  has normal reflexes. She displays normal reflexes.  Negative clonus   Skin: Skin is warm. She is not diaphoretic.  Psychiatric: Her behavior is normal.   Fetal Tracing: Baseline: 130 bpm Variability: Moderate  Accelerations: 15x15 Decelerations: None Toco: Irregular   MAU Course  Procedures  None  MDM  PIH workup ordered 0/10 pain.  Category 1 fetal tracing.  Discussed labs and BP readings with Dr. Mindi SlickerBanga. Admit for Severe Pre E   Assessment and Plan   A:  1. Severe preeclampsia, third trimester   2. [redacted] weeks gestation of pregnancy     P:  Admit to labor and delivery BMZ completed Magnesium ordered GBS PCR collected.  Labetalol 200 mg PO start now per Dr. Mindi SlickerBanga.    Duane Lopeasch, Damaya Channing I, NP 08/14/2018 8:26 PM

## 2018-08-14 NOTE — MAU Note (Signed)
Pt sent from MD office for BP evaluation, reports "my blood pressure was really high and there's more protein in my urine today".  Denies H/A, visual disturbances or epigastric pain.  Endorses +FM.  Denies VB or LOF.

## 2018-08-14 NOTE — H&P (Addendum)
Sara Wolf is a 33 y.o.prime  female presenting at 29 3/7wks for iol for severe preeclampsia. Pt was seen in office today and BP was 192/110. BP was confirmed in severe range in MAU and finally improved with iv labetalol. Pt reported having mild HA intermittently. Proteinuria and elevated creatinine also noted. Pt received BMZ two weeks ago for similar reasons.  Pt admitted for severe preeclampsia mgmt and induction of labor.  She is dated per 14week [place]. Pregnancy complicated as well with late to care and asthma. MSAFP and essential panel done Pt admitted and a dose of cytotec given 4 hrs ago - was 1-2cm dilated.  OB History    Gravida  1   Para      Term      Preterm      AB      Living  0     SAB      TAB      Ectopic      Multiple      Live Births             Past Medical History:  Diagnosis Date  . Asthma   . Complication of anesthesia    'took me a while to wake up'  . Obesity (BMI 30-39.9)    Past Surgical History:  Procedure Laterality Date  . BREAST REDUCTION SURGERY     Family History: family history includes Asthma in her father; Cancer in her paternal grandfather; Diabetes in her maternal grandfather; Healthy in her mother; Hyperlipidemia in her father; Hypertension in her father; Stroke in her maternal grandmother. Social History:  reports that she has never smoked. She has never used smokeless tobacco. She reports previous alcohol use. She reports that she does not use drugs.     Maternal Diabetes: No Genetic Screening: Normal Maternal Ultrasounds/Referrals: Normal Fetal Ultrasounds or other Referrals:  None Maternal Substance Abuse:  No Significant Maternal Medications:  None Significant Maternal Lab Results:  Group B Strep negative Other Comments:  None  Review of Systems  Constitutional: Negative for chills, fever, malaise/fatigue and weight loss.  Eyes: Negative for blurred vision and double vision.  Respiratory: Negative for  shortness of breath.   Cardiovascular: Negative for chest pain and palpitations.  Gastrointestinal: Negative for abdominal pain, heartburn, nausea and vomiting.  Genitourinary: Negative for dysuria and urgency.  Musculoskeletal: Negative for myalgias.  Skin: Negative for itching and rash.  Neurological: Positive for headaches. Negative for dizziness.  Endo/Heme/Allergies: Negative for environmental allergies. Does not bruise/bleed easily.  Psychiatric/Behavioral: Negative for depression, hallucinations, substance abuse and suicidal ideas. The patient is nervous/anxious.    Maternal Medical History:  Reason for admission: Nausea. Severe preecclampsia  Contractions: Frequency: irregular.   Perceived severity is mild.    Fetal activity: Perceived fetal activity is normal.   Last perceived fetal movement was within the past hour.    Prenatal complications: PIH and pre-eclampsia.   Prenatal Complications - Diabetes: none.    Dilation: 3 Effacement (%): 60 Station: -2 Exam by:: Dr. Terri Piedra Blood pressure (!) 158/94, pulse 87, temperature 98.4 F (36.9 C), temperature source Oral, resp. rate 16, height 5\' 2"  (1.575 m), weight 111.2 kg, last menstrual period 12/02/2017, SpO2 99 %. Maternal Exam:  Uterine Assessment: Contraction strength is mild.  Contraction frequency is irregular.   Abdomen: Patient reports generalized tenderness.  Estimated fetal weight is AGA.   Fetal presentation: vertex  Introitus: Normal vulva. Vulva is negative for condylomata and lesion.  Normal vagina.  Vagina is negative for condylomata.  Amniotic fluid character: clear.  Pelvis: adequate for delivery.   Cervix: Cervix evaluated by digital exam.     Fetal Exam Fetal Monitor Review: Baseline rate: 120.  Variability: moderate (6-25 bpm).   Pattern: no decelerations.    Fetal State Assessment: Category I - tracings are normal.     Physical Exam  Constitutional: She is oriented to person, place,  and time. She appears well-developed.  Neck: Normal range of motion.  Cardiovascular: Normal rate.  Respiratory: Effort normal.  GI: Soft. There is generalized abdominal tenderness.  Genitourinary:    Vulva, vagina and uterus normal.     No vulval condylomata or lesion noted.   Musculoskeletal: Normal range of motion.  Neurological: She is alert and oriented to person, place, and time.  Skin: Skin is warm.  Psychiatric: She has a normal mood and affect. Her behavior is normal. Judgment and thought content normal.    Prenatal labs: ABO, Rh: --/--/B POS, B POS Performed at [facility], [address] [place], [place] [address]  [phone](07/13 1535) Antibody: NEG (07/13 1535) Rubella: Nonimmune (02/17 0000) RPR: Nonreactive (02/17 0000)  HBsAg: Negative (02/17 0000)  HIV: Non-reactive (02/17 0000)  GBS:     Assessment/Plan: 32yo prime at 5835 3/[redacted]wks gestation with severe preE Pt started on MgSo4 with a 4gm bolus; now running at 2gm/hr Labetalol protocol given - has received up to three doses 100mg  labetalol PO given x 1 at 630pm Rapid GBS done - neg S/p[ cytotec x1 and now AROM performed with clear fluid noted Monitor for next 1-2hrs and augment with pitocin if indicated Pain control prn  Anticipate svd  Kaiyana Bedore W Lady Wisham 08/14/2018, 10:54 PM

## 2018-08-15 ENCOUNTER — Inpatient Hospital Stay (HOSPITAL_COMMUNITY): Payer: 59 | Admitting: Anesthesiology

## 2018-08-15 ENCOUNTER — Encounter (HOSPITAL_COMMUNITY): Payer: Self-pay

## 2018-08-15 DIAGNOSIS — O1413 Severe pre-eclampsia, third trimester: Secondary | ICD-10-CM | POA: Diagnosis present

## 2018-08-15 LAB — COMPREHENSIVE METABOLIC PANEL
ALT: 14 U/L (ref 0–44)
AST: 23 U/L (ref 15–41)
Albumin: 2.2 g/dL — ABNORMAL LOW (ref 3.5–5.0)
Alkaline Phosphatase: 95 U/L (ref 38–126)
Anion gap: 10 (ref 5–15)
BUN: 12 mg/dL (ref 6–20)
CO2: 18 mmol/L — ABNORMAL LOW (ref 22–32)
Calcium: 7.4 mg/dL — ABNORMAL LOW (ref 8.9–10.3)
Chloride: 105 mmol/L (ref 98–111)
Creatinine, Ser: 0.77 mg/dL (ref 0.44–1.00)
GFR calc Af Amer: >60 mL/min (ref >60)
GFR calc non Af Amer: >60 mL/min (ref >60)
Glucose, Bld: 109 mg/dL — ABNORMAL HIGH (ref 70–99)
Potassium: 4.1 mmol/L (ref 3.5–5.1)
Sodium: 133 mmol/L — ABNORMAL LOW (ref 135–145)
Total Bilirubin: 0.4 mg/dL (ref 0.3–1.2)
Total Protein: 5.2 g/dL — ABNORMAL LOW (ref 6.5–8.1)

## 2018-08-15 LAB — CBC WITH DIFFERENTIAL/PLATELET
Abs Immature Granulocytes: 0.09 10*3/uL — ABNORMAL HIGH (ref 0.00–0.07)
Basophils Absolute: 0 10*3/uL (ref 0.0–0.1)
Basophils Relative: 0 %
Eosinophils Absolute: 0 10*3/uL (ref 0.0–0.5)
Eosinophils Relative: 0 %
HCT: 38 % (ref 36.0–46.0)
Hemoglobin: 12.7 g/dL (ref 12.0–15.0)
Immature Granulocytes: 1 %
Lymphocytes Relative: 6 %
Lymphs Abs: 0.9 10*3/uL (ref 0.7–4.0)
MCH: 29 pg (ref 26.0–34.0)
MCHC: 33.4 g/dL (ref 30.0–36.0)
MCV: 86.8 fL (ref 80.0–100.0)
Monocytes Absolute: 0.7 10*3/uL (ref 0.1–1.0)
Monocytes Relative: 5 %
Neutro Abs: 13.4 10*3/uL — ABNORMAL HIGH (ref 1.7–7.7)
Neutrophils Relative %: 88 %
Platelets: 203 10*3/uL (ref 150–400)
RBC: 4.38 MIL/uL (ref 3.87–5.11)
RDW: 13.3 % (ref 11.5–15.5)
WBC: 15.1 10*3/uL — ABNORMAL HIGH (ref 4.0–10.5)
nRBC: 0 % (ref 0.0–0.2)

## 2018-08-15 LAB — CBC
HCT: 36.9 % (ref 36.0–46.0)
Hemoglobin: 12.6 g/dL (ref 12.0–15.0)
MCH: 29.2 pg (ref 26.0–34.0)
MCHC: 34.1 g/dL (ref 30.0–36.0)
MCV: 85.4 fL (ref 80.0–100.0)
Platelets: 206 10*3/uL (ref 150–400)
RBC: 4.32 MIL/uL (ref 3.87–5.11)
RDW: 13.3 % (ref 11.5–15.5)
WBC: 15.2 10*3/uL — ABNORMAL HIGH (ref 4.0–10.5)
nRBC: 0 % (ref 0.0–0.2)

## 2018-08-15 LAB — ABO/RH: ABO/RH(D): B POS

## 2018-08-15 LAB — RPR: RPR Ser Ql: NONREACTIVE

## 2018-08-15 MED ORDER — OXYTOCIN 40 UNITS IN NORMAL SALINE INFUSION - SIMPLE MED
1.0000 m[IU]/min | INTRAVENOUS | Status: DC
  Administered 2018-08-15: 2 m[IU]/min via INTRAVENOUS

## 2018-08-15 MED ORDER — SIMETHICONE 80 MG PO CHEW: 80.0000 mg | CHEWABLE_TABLET | ORAL | Status: DC | PRN

## 2018-08-15 MED ORDER — LABETALOL HCL 200 MG PO TABS
200.0000 mg | ORAL_TABLET | Freq: Two times a day (BID) | ORAL | Status: DC
  Administered 2018-08-15: 200 mg via ORAL
  Filled 2018-08-15: qty 1

## 2018-08-15 MED ORDER — SENNOSIDES-DOCUSATE SODIUM 8.6-50 MG PO TABS
2.0000 | ORAL_TABLET | ORAL | Status: DC
  Administered 2018-08-15 – 2018-08-16 (×2): 2 via ORAL
  Filled 2018-08-15 (×3): qty 2

## 2018-08-15 MED ORDER — ONDANSETRON HCL 4 MG PO TABS: 4.0000 mg | ORAL_TABLET | ORAL | Status: DC | PRN

## 2018-08-15 MED ORDER — FENTANYL-BUPIVACAINE-NACL 0.5-0.125-0.9 MG/250ML-% EP SOLN
12.0000 mL/h | EPIDURAL | Status: DC | PRN
  Filled 2018-08-15: qty 250

## 2018-08-15 MED ORDER — LACTATED RINGERS IV SOLN
500.0000 mL | Freq: Once | INTRAVENOUS | Status: AC
  Administered 2018-08-15: 500 mL via INTRAVENOUS

## 2018-08-15 MED ORDER — TETANUS-DIPHTH-ACELL PERTUSSIS 5-2.5-18.5 LF-MCG/0.5 IM SUSP: 0.5000 mL | Freq: Once | INTRAMUSCULAR | Status: DC

## 2018-08-15 MED ORDER — TERBUTALINE SULFATE 1 MG/ML IJ SOLN: 0.2500 mg | Freq: Once | INTRAMUSCULAR | Status: DC | PRN

## 2018-08-15 MED ORDER — BENZOCAINE-MENTHOL 20-0.5 % EX AERO
1.0000 "application " | INHALATION_SPRAY | CUTANEOUS | Status: DC | PRN
  Administered 2018-08-15: 1 via TOPICAL
  Filled 2018-08-15 (×2): qty 56

## 2018-08-15 MED ORDER — COCONUT OIL OIL
1.0000 "application " | TOPICAL_OIL | Status: DC | PRN
  Administered 2018-08-15: 1 via TOPICAL

## 2018-08-15 MED ORDER — PHENYLEPHRINE 40 MCG/ML (10ML) SYRINGE FOR IV PUSH (FOR BLOOD PRESSURE SUPPORT): 80.0000 ug | PREFILLED_SYRINGE | INTRAVENOUS | Status: DC | PRN

## 2018-08-15 MED ORDER — FENTANYL-BUPIVACAINE-NACL 0.5-0.125-0.9 MG/250ML-% EP SOLN: 12.0000 mL/h | EPIDURAL | Status: DC | PRN

## 2018-08-15 MED ORDER — PNEUMOCOCCAL VAC POLYVALENT 25 MCG/0.5ML IJ INJ
0.5000 mL | INJECTION | INTRAMUSCULAR | Status: DC
  Filled 2018-08-15: qty 0.5

## 2018-08-15 MED ORDER — BUTORPHANOL TARTRATE 1 MG/ML IJ SOLN
1.0000 mg | INTRAMUSCULAR | Status: DC | PRN
  Administered 2018-08-15 (×2): 1 mg via INTRAVENOUS
  Filled 2018-08-15 (×2): qty 1

## 2018-08-15 MED ORDER — DIPHENHYDRAMINE HCL 25 MG PO CAPS: 25.0000 mg | ORAL_CAPSULE | Freq: Four times a day (QID) | ORAL | Status: DC | PRN

## 2018-08-15 MED ORDER — ONDANSETRON HCL 4 MG/2ML IJ SOLN: 4.0000 mg | INTRAMUSCULAR | Status: DC | PRN

## 2018-08-15 MED ORDER — ZOLPIDEM TARTRATE 5 MG PO TABS: 5.0000 mg | ORAL_TABLET | Freq: Every evening | ORAL | Status: DC | PRN

## 2018-08-15 MED ORDER — IBUPROFEN 600 MG PO TABS
600.0000 mg | ORAL_TABLET | Freq: Four times a day (QID) | ORAL | Status: DC
  Administered 2018-08-15 – 2018-08-17 (×9): 600 mg via ORAL
  Filled 2018-08-15 (×9): qty 1

## 2018-08-15 MED ORDER — LABETALOL HCL 200 MG PO TABS
200.0000 mg | ORAL_TABLET | Freq: Two times a day (BID) | ORAL | Status: DC
  Administered 2018-08-15 – 2018-08-16 (×3): 200 mg via ORAL
  Filled 2018-08-15 (×3): qty 1

## 2018-08-15 MED ORDER — DIBUCAINE (PERIANAL) 1 % EX OINT: 1.0000 "application " | TOPICAL_OINTMENT | CUTANEOUS | Status: DC | PRN

## 2018-08-15 MED ORDER — LIDOCAINE HCL (PF) 1 % IJ SOLN
INTRAMUSCULAR | Status: DC | PRN
  Administered 2018-08-15 (×2): 5 mL via EPIDURAL

## 2018-08-15 MED ORDER — LACTATED RINGERS IV SOLN
INTRAVENOUS | Status: DC
  Administered 2018-08-15 – 2018-08-16 (×2): via INTRAVENOUS

## 2018-08-15 MED ORDER — EPHEDRINE 5 MG/ML INJ: 10.0000 mg | INTRAVENOUS | Status: DC | PRN

## 2018-08-15 MED ORDER — WITCH HAZEL-GLYCERIN EX PADS: 1.0000 "application " | MEDICATED_PAD | CUTANEOUS | Status: DC | PRN

## 2018-08-15 MED ORDER — DIPHENHYDRAMINE HCL 50 MG/ML IJ SOLN: 12.5000 mg | INTRAMUSCULAR | Status: DC | PRN

## 2018-08-15 MED ORDER — PRENATAL MULTIVITAMIN CH
1.0000 | ORAL_TABLET | Freq: Every day | ORAL | Status: DC
  Administered 2018-08-15 – 2018-08-17 (×3): 1 via ORAL
  Filled 2018-08-15 (×3): qty 1

## 2018-08-15 MED ORDER — ACETAMINOPHEN 325 MG PO TABS: 650.0000 mg | ORAL_TABLET | ORAL | Status: DC | PRN

## 2018-08-15 MED ORDER — SODIUM CHLORIDE (PF) 0.9 % IJ SOLN
INTRAMUSCULAR | Status: DC | PRN
  Administered 2018-08-15: 12 mL/h via EPIDURAL

## 2018-08-15 NOTE — Anesthesia Procedure Notes (Signed)
Epidural Patient location during procedure: OB  Staffing Anesthesiologist: Stryker Veasey, MD Performed: anesthesiologist   Preanesthetic Checklist Completed: patient identified, site marked, surgical consent, pre-op evaluation, timeout performed, IV checked, risks and benefits discussed and monitors and equipment checked  Epidural Patient position: sitting Prep: DuraPrep Patient monitoring: heart rate, continuous pulse ox and blood pressure Approach: midline Location: L3-L4 Injection technique: LOR saline  Needle:  Needle type: Tuohy  Needle gauge: 17 G Needle length: 9 cm and 9 Needle insertion depth: 7 cm Catheter type: closed end flexible Catheter size: 20 Guage Catheter at skin depth: 12 cm Test dose: negative  Assessment Events: blood not aspirated, injection not painful, no injection resistance, negative IV test and no paresthesia  Additional Notes Patient identified. Risks/Benefits/Options discussed with patient including but not limited to bleeding, infection, nerve damage, paralysis, failed block, incomplete pain control, headache, blood pressure changes, nausea, vomiting, reactions to medication both or allergic, itching and postpartum back pain. Confirmed with bedside nurse the patient's most recent platelet count. Confirmed with patient that they are not currently taking any anticoagulation, have any bleeding history or any family history of bleeding disorders. Patient expressed understanding and wished to proceed. All questions were answered. Sterile technique was used throughout the entire procedure. Please see nursing notes for vital signs. Test dose was given through epidural needle and negative prior to continuing to dose epidural or start infusion. Warning signs of high block given to the patient including shortness of breath, tingling/numbness in hands, complete motor block, or any concerning symptoms with instructions to call for help. Patient was given  instructions on fall risk and not to get out of bed. All questions and concerns addressed with instructions to call with any issues.     

## 2018-08-15 NOTE — Progress Notes (Signed)
Patient ID: Sara Wolf, female   DOB: Oct 23, 1985, 33 y.o.   MRN: [id number] Pt feeling well, just a little groggy from magnesium but alert and talking   BP 150-160/80-90's UOP adequate but not diuresing yet  Labs WNL post delivery, creatinine improved to .77   S/p NSVD with severe preeclampsia BP not severe, d/w pt will start procardia tomorrow when comes of magnesium, on labetalol 200mg  po BID  Baby doing well, in room

## 2018-08-15 NOTE — Progress Notes (Signed)
Patient off monitors at 0515 in room 208 to move to room 210 for schedule maintenance to be performed in 208 this AM. Patient transported in the bed to room 210 and monitors reapplied at 0521.

## 2018-08-15 NOTE — Progress Notes (Signed)
Patient ID: Sara Wolf, female   DOB: 21-Aug-1985, 33 y.o.   MRN: [id number] Pt comfortable with epidural  BP acceptable at 140-150/80-90  Cervix 6-7cm/80/-1  Internals placed to follow FHR closely.  Minimal variability but not unexpected with magnesium and stadol.  WIll adjust pitocin to get adequate labor. Platelets stable at 206K Pt received LR with D5 overnight inadvertently.  Since no GDM should not be an issue

## 2018-08-15 NOTE — Anesthesia Postprocedure Evaluation (Signed)
Anesthesia Post Note  Patient: Sara Wolf  Procedure(s) Performed: AN AD [place]     Patient location during evaluation: Mother Baby Anesthesia Type: Epidural Level of consciousness: awake and alert Pain management: pain level controlled Vital Signs Assessment: post-procedure vital signs reviewed and stable Respiratory status: spontaneous breathing, nonlabored ventilation and respiratory function stable Cardiovascular status: stable Postop Assessment: no headache, no backache and epidural receding Anesthetic complications: no    Last Vitals:  Vitals:   08/15/18 1515 08/15/18 1516  BP:  (!) 164/90  Pulse:  87  Resp:  18  Temp:  36.6 C  SpO2: 99% 99%    Last Pain:  Vitals:   08/15/18 1516  TempSrc: Oral  PainSc:    Pain Goal: Patients Stated Pain Goal: 2 (08/15/18 0830)                 Maurice Fotheringham

## 2018-08-15 NOTE — Progress Notes (Signed)
RN noted Lactated Ringers and Dextrose 5% infusing through patient IV at 100 mL/min.  Noted approximately 700 mL infused. RN replaced bag and tubing with Lactated Ringers per order.  Patient asymptomatic.  Dr. Marvel Plan made aware.  No new orders at this time. Will continue to monitor patient status.

## 2018-08-15 NOTE — Anesthesia Preprocedure Evaluation (Signed)
Anesthesia Evaluation  Patient identified by MRN, date of birth, ID band Patient awake    Reviewed: Allergy & Precautions, H&P , NPO status , Patient's Chart, lab work & pertinent test results  History of Anesthesia Complications Negative for: history of anesthetic complications  Airway Mallampati: II  TM Distance: >3 FB Neck ROM: full    Dental no notable dental hx. (+) Teeth Intact   Pulmonary asthma ,    Pulmonary exam normal breath sounds clear to auscultation       Cardiovascular negative cardio ROS Normal cardiovascular exam Rhythm:regular Rate:Normal     Neuro/Psych negative neurological ROS  negative psych ROS   GI/Hepatic negative GI ROS, Neg liver ROS,   Endo/Other  Morbid obesity  Renal/GU negative Renal ROS  negative genitourinary   Musculoskeletal   Abdominal   Peds  Hematology negative hematology ROS (+)   Anesthesia Other Findings   Reproductive/Obstetrics (+) Pregnancy (PreE)                             Anesthesia Physical Anesthesia Plan  ASA: III  Anesthesia Plan: Epidural   Post-op Pain Management:    Induction:   PONV Risk Score and Plan:   Airway Management Planned:   Additional Equipment:   Intra-op Plan:   Post-operative Plan:   Informed Consent: I have reviewed the patients History and Physical, chart, labs and discussed the procedure including the risks, benefits and alternatives for the proposed anesthesia with the patient or authorized representative who has indicated his/her understanding and acceptance.       Plan Discussed with:   Anesthesia Plan Comments:         Anesthesia Quick Evaluation

## 2018-08-15 NOTE — Progress Notes (Signed)
Patient ID: Sara Wolf, female   DOB: 1985/10/07, 33 y.o.   MRN: [id number] Pt reports increasing discomfort with contractions. Requesting epidural. Continues to have clear LOF from SROM. +Fms VSS - 144/91 EFM - 120s, minimal variability, occasional variable, no decels - cat 1 TOCO- contractions q 2-74mins SVE - 5/80/-1  A/P: Prime at 35 4/7wks progressing well on pitocin; now at 60mus         Await results of cbc then epidural for pain relief         Labetalol 200mg  po bid - second dose due now         Continue on MgSO4 for up to 24hrs for seizure prophylaxis         Anticipate svd

## 2018-08-16 LAB — CBC
HCT: 34.4 % — ABNORMAL LOW (ref 36.0–46.0)
Hemoglobin: 11.4 g/dL — ABNORMAL LOW (ref 12.0–15.0)
MCH: 29.2 pg (ref 26.0–34.0)
MCHC: 33.1 g/dL (ref 30.0–36.0)
MCV: 88 fL (ref 80.0–100.0)
Platelets: 167 10*3/uL (ref 150–400)
RBC: 3.91 MIL/uL (ref 3.87–5.11)
RDW: 13.7 % (ref 11.5–15.5)
WBC: 10.9 10*3/uL — ABNORMAL HIGH (ref 4.0–10.5)
nRBC: 0 % (ref 0.0–0.2)

## 2018-08-16 LAB — COMPREHENSIVE METABOLIC PANEL
ALT: 13 U/L (ref 0–44)
AST: 20 U/L (ref 15–41)
Albumin: 2 g/dL — ABNORMAL LOW (ref 3.5–5.0)
Alkaline Phosphatase: 75 U/L (ref 38–126)
Anion gap: 8 (ref 5–15)
BUN: 11 mg/dL (ref 6–20)
CO2: 24 mmol/L (ref 22–32)
Calcium: 7.2 mg/dL — ABNORMAL LOW (ref 8.9–10.3)
Chloride: 106 mmol/L (ref 98–111)
Creatinine, Ser: 0.93 mg/dL (ref 0.44–1.00)
GFR calc Af Amer: >60 mL/min (ref >60)
GFR calc non Af Amer: >60 mL/min (ref >60)
Glucose, Bld: 93 mg/dL (ref 70–99)
Potassium: 3.9 mmol/L (ref 3.5–5.1)
Sodium: 138 mmol/L (ref 135–145)
Total Bilirubin: 0.7 mg/dL (ref 0.3–1.2)
Total Protein: 4.7 g/dL — ABNORMAL LOW (ref 6.5–8.1)

## 2018-08-16 MED ORDER — MOMETASONE FURO-FORMOTEROL FUM 200-5 MCG/ACT IN AERO
2.0000 | INHALATION_SPRAY | Freq: Two times a day (BID) | RESPIRATORY_TRACT | Status: DC
  Administered 2018-08-16 – 2018-08-17 (×2): 2 via RESPIRATORY_TRACT
  Filled 2018-08-16: qty 8.8

## 2018-08-16 MED ORDER — NON FORMULARY: Freq: Two times a day (BID) | Status: DC

## 2018-08-16 NOTE — Lactation Note (Signed)
This note was copied from a baby's chart. Lactation Consultation Note  Patient Name: Sara Wolf DVVOH'Y Date: 08/16/2018 Reason for consult: Follow-up assessment;Late-preterm 34-36.6wks;Primapara;1st time breastfeeding;Infant < 6lbs;Breast reduction  P1 mother whose infant is now 48 hours old.  This is a LPTI at 35+4 weeks weighing < 6 lbs.  Mother has had a breast reduction.  Baby was doing STS with mother when I arrived.  She was quiet and awake.  Mother is somewhat concerned that she "does not have anything" in her breasts.  Reassured her that this is typical for this age and stressed the importance of hand expression before/after pumping and to pump every 2 1/2-3 hours to help facilitate a good milk supply.  Discussed the possible barriers with having a breast reduction and parents are aware.  Explained that mother could have a full milk supply in time or that she may have to supplement in addition to breast feeding.  Mother is interested in continuing to be proactive with obtaining a good milk supply.  Parents stated that baby has shown feeding cues.  Mother stated that she will latch but does not really suck at the breast.  Encouraged mother to continue lots of STS and to allow baby time to nuzzle and latch even if she is not actively sucking.  Mother will continue to try.  She did not wish to review the LPTI policy and stated that she understands what the previous LC had explained.  Mother will feed back any EBM she obtains to baby.  Mother will awaken and feed baby every three hours if she does not self awaken.  Encouraged mother to call for any further questions/concerns she may have.  Father present and supportive.     Maternal Data Formula Feeding for Exclusion: Yes Reason for exclusion: Mother's choice to formula and breast feed on admission Has patient been taught Hand Expression?: Yes Does the patient have breastfeeding experience prior to this delivery?: No  Feeding     LATCH Score                   Interventions    Lactation Tools Discussed/Used Tools: Pump Breast pump type: Double-Electric Breast Pump WIC Program: No Pump Review: (Mother needed no review on pump or set up)   Consult Status Consult Status: Follow-up Date: 08/17/18 Follow-up type: In-patient    [name] 08/16/2018, 9:15 AM

## 2018-08-16 NOTE — Progress Notes (Addendum)
Post Partum Day 1 Subjective: no complaints, up ad lib, voiding, tolerating PO and nl lochia, pain controlled Mg until 12:30 for severe PreE.  Bp elevated will closely monitor, likely increase meds  Objective: Blood pressure (!) 152/93, pulse 79, temperature 97.6 F (36.4 C), temperature source Oral, resp. rate 18, height 5\' 2"  (1.575 m), weight 111.2 kg, last menstrual period 12/02/2017, SpO2 98 %, unknown if currently breastfeeding.  Physical Exam:  General: alert and no distress Lochia: appropriate Uterine Fundus: firm   Recent Labs    08/15/18 0634 08/15/18 1352  HGB 12.6 12.7  HCT 36.9 38.0    Assessment/Plan: Plan for discharge tomorrow, Breastfeeding and Lactation consult.  Close monitoring BP.  Currently labetalol 200mg  bid.  Mg off this PM.  Watch urine output.     LOS: 2 days   Sara Wolf 08/16/2018, 7:28 AM

## 2018-08-16 NOTE — Lactation Note (Signed)
This note was copied from a baby's chart. Lactation Consultation Note Baby 57 hrs old.  LPI information sheet given to mom and reviewed.  LPI newborn behavior and feeding habits discussed.  Stressed importance of strict I&O, supplementing, STS, milk storage, supply and demand. Mom states understanding. Mom keeps saying that she "doesn't have anything" regarding milk. Explained to mom colostrum comes first, mature milk come 3-5 days usually. Hand expressed dots of colostrum to short shaft everted compressible nipples. Noted scares to lower breast. Mom had breast reduction from DD to C cup.  LC expressed even more importance of pumping. Mom has DEBP at bedside. Mom stated she didn't get anything when she pumped. Explained normal. Encouraged breast massage during pumping and hand expression after pumping giving baby any colostrum she gets. Encouraged to give colostrum first then supplement w/formula to equal amount needed according to hrs of age. Mom states understanding. Mom states her DEBP is coming in the mail. Encouraged pumping every 3 hrs for Lactation induction/stimulation. Baby wt. 4.13 lbs. Reviewed head covering at all times, keeping baby warm, not to have bundled during feedings. May cover over baby but not around.  Encouraged to notify RN if baby not feeding well or needs assistance.  Mom has Similac 22 cal. At bedside. Reviewed formula feeding and milk storage. Encouraged to call for assistance or questions. Lactation brochure given.  Patient Name: Sara Wolf ASNKN'L Date: 08/16/2018 Reason for consult: Initial assessment;Late-preterm 34-36.6wks;1st time breastfeeding;Infant < 6lbs;Breast reduction   Maternal Data Has patient been taught Hand Expression?: Yes Does the patient have breastfeeding experience prior to this delivery?: No  Feeding Feeding Type: Bottle Fed - Formula Nipple Type: Slow - flow  LATCH Score       Type of Nipple: Everted at rest and  after stimulation  Comfort (Breast/Nipple): Soft / non-tender        Interventions Interventions: Breast feeding basics reviewed;DEBP;Hand express  Lactation Tools Discussed/Used Tools: Pump Breast pump type: Double-Electric Breast Pump WIC Program: No Pump Review: Setup, frequency, and cleaning;Milk Storage Initiated by:: RN Date initiated:: 08/15/18   Consult Status Consult Status: Follow-up Date: 08/17/18 Follow-up type: In-patient    Theodoro Kalata 08/16/2018, 1:53 AM

## 2018-08-17 LAB — BIRTH TISSUE RECOVERY COLLECTION (PLACENTA DONATION)

## 2018-08-17 MED ORDER — NIFEDIPINE ER 60 MG PO TB24: 60.0000 mg | ORAL_TABLET | Freq: Every day | ORAL | 1 refills | Status: DC

## 2018-08-17 MED ORDER — NIFEDIPINE ER OSMOTIC RELEASE 30 MG PO TB24: 60.0000 mg | ORAL_TABLET | Freq: Every day | ORAL | Status: DC

## 2018-08-17 MED ORDER — NIFEDIPINE ER OSMOTIC RELEASE 30 MG PO TB24
30.0000 mg | ORAL_TABLET | Freq: Every day | ORAL | Status: DC
  Administered 2018-08-17: 10:00:00 30 mg via ORAL
  Filled 2018-08-17: qty 1

## 2018-08-17 MED ORDER — IBUPROFEN 600 MG PO TABS: 600.0000 mg | ORAL_TABLET | Freq: Four times a day (QID) | ORAL | 0 refills | Status: DC

## 2018-08-17 MED ORDER — NIFEDIPINE ER OSMOTIC RELEASE 30 MG PO TB24
30.0000 mg | ORAL_TABLET | Freq: Once | ORAL | Status: AC
  Administered 2018-08-17: 30 mg via ORAL
  Filled 2018-08-17: qty 1

## 2018-08-17 NOTE — Progress Notes (Signed)
Feels fine BP 140-150/90 on Procardia XL 30 mg Will give another dose of Procardia 30 mg now, d/c home on 60 mg daily

## 2018-08-17 NOTE — Progress Notes (Signed)
Ambulated out with infant and husband bracelet matched  [phone]  And hugs 69 off

## 2018-08-17 NOTE — Discharge Instructions (Signed)
As per discharge pamphlet °

## 2018-08-17 NOTE — Progress Notes (Signed)
PPD #2 severe preeclampsia Feels good, no problems Afeb, VSS, BP 120-140/70-80 Fundus firm Will change to procardia, monitor BP today, d/c home this pm if stable

## 2018-08-17 NOTE — Lactation Note (Signed)
This note was copied from a baby's chart. Lactation Consultation Note  Patient Name: Girl Nanie Dunkleberger OMAYO'K Date: 08/17/2018   Baby 70 hours old.  [redacted]w[redacted]d  < 5 lbs. Hx of breast reduction. Reviewed hand expression and mother was happy to see drops. Attempted latching.  Baby sucked a few times and became frustrated and came off breast. FOB states he was waiting until 3 hours to feed baby.  Provided education on increasing volume per day of life and as baby desires and on frequency.  Discussed feeding on demand at least q 3 hours by watching infant's feeding cues. Mother states she pumped this morning with no volulme at 0700. Demonstrated how to do hands on pumping and referred mother to bfar.org for information regarding breastfeeding after surgery. Mother is waiting on DEBP from insurance.  Reviewed how to convert kit to DEBP manual and provided mother with manual pump. Reviewed engorgement care and monitoring voids/stools. Encouraged mother to continue to pump.        Maternal Data    Feeding Feeding Type: Bottle Fed - Formula  LATCH Score                   Interventions    Lactation Tools Discussed/Used     Consult Status      BCarlye Grippe7/16/2020, 9:00 AM

## 2018-08-17 NOTE — Discharge Summary (Signed)
OB Discharge Summary     Patient Name: Sara Wolf DOB: 1986-01-20 MRN: [id number]  Date of admission: 08/14/2018 Delivering MD: Huel CoteICHARDSON, KATHY   Date of discharge: 08/17/2018  Admitting diagnosis: Monitoring Intrauterine pregnancy: 484w4d     Secondary diagnosis:  Active Problems:   Severe preeclampsia   Severe preeclampsia, third trimester      Discharge diagnosis: Preterm Pregnancy Delivered and Preeclampsia (severe)                                                                                                Hospital course:  Induction of Labor With Vaginal Delivery   33 y.o. yo [id number] at 534w4d was admitted to the hospital 08/14/2018 for induction of labor.  Indication for induction: Preeclampsia.  Patient had an uncomplicated labor course as follows: Membrane Rupture Time/Date: 10:34 PM ,08/14/2018   Intrapartum Procedures: Episiotomy: None [1]                                         Lacerations:  1st degree [2]  Patient had delivery of a Viable infant.  Information for the patient's newborn:  Wilburt FinlayValliere, Girl Petula [[id number]][[id number]]  Delivery Method: Vag-Spont    08/15/2018  Details of delivery can be found in separate delivery note.  Patient had a routine postpartum course, changed from Labetalol to Procardia, BP slightly elevated but not severe. Patient is discharged home 08/17/18.  Physical exam  Vitals:   08/17/18 0800 08/17/18 1200 08/17/18 1547 08/17/18 1624  BP: 131/82 (!) 144/92 (!) 159/90 (!) 146/93  Pulse: 100 86 93 93  Resp: 18 18  18   Temp: 98.4 F (36.9 C) 98.1 F (36.7 C)  98.3 F (36.8 C)  TempSrc: Oral Oral  Oral  SpO2: 98% 95%  100%  Weight:      Height:       General: alert Lochia: appropriate Uterine Fundus: firm  Labs: Lab Results  Component Value Date   WBC 10.9 (H) 08/16/2018   HGB 11.4 (L) 08/16/2018   HCT 34.4 (L) 08/16/2018   MCV 88.0 08/16/2018   PLT 167 08/16/2018   CMP Latest Ref Rng & Units 08/16/2018  Glucose 70 - 99  mg/dL 93  BUN 6 - 20 mg/dL 11  Creatinine 8.650.44 - 7.841.00 mg/dL 6.960.93  Sodium 295135 - 284145 mmol/L 138  Potassium 3.5 - 5.1 mmol/L 3.9  Chloride 98 - 111 mmol/L 106  CO2 22 - 32 mmol/L 24  Calcium 8.9 - 10.3 mg/dL 7.2(L)  Total Protein 6.5 - 8.1 g/dL 4.7(L)  Total Bilirubin 0.3 - 1.2 mg/dL 0.7  Alkaline Phos 38 - 126 U/L 75  AST 15 - 41 U/L 20  ALT 0 - 44 U/L 13    Discharge instruction: per After Visit Summary and "Baby and Me Booklet".  After visit meds:  Allergies as of 08/17/2018      Reactions   Apple    Peach [prunus Persica]    Pear       Medication List  TAKE these medications   acetaminophen 325 MG tablet Commonly known as: TYLENOL Take 650 mg by mouth every 6 (six) hours as needed.   albuterol 108 (90 Base) MCG/ACT inhaler Commonly known as: VENTOLIN HFA Inhale 2 puffs into the lungs every 6 (six) hours as needed for wheezing or shortness of breath.   calcium carbonate 500 MG chewable tablet Commonly known as: TUMS - dosed in mg elemental calcium Chew 1 tablet by mouth daily.   Fluticasone-Salmeterol 250-50 MCG/DOSE Aepb Commonly known as: ADVAIR Inhale 1 puff into the lungs 2 (two) times daily.   ibuprofen 600 MG tablet Commonly known as: ADVIL Take 1 tablet (600 mg total) by mouth every 6 (six) hours.   NIFEdipine 60 MG 24 hr tablet Commonly known as: ADALAT CC Take 1 tablet (60 mg total) by mouth daily. Start taking on: August 18, 2018   prenatal multivitamin Tabs tablet Take 1 tablet by mouth daily at 12 noon.       Diet: routine diet  Activity: Advance as tolerated. Pelvic rest for 6 weeks.   Outpatient follow up:4 days  Newborn Data: Live born female  Birth Weight: 4 lb 13.1 oz (2186 g) APGAR: 7, 8  Newborn Delivery   Birth date/time: 08/15/2018 12:26:00 Delivery type: Vaginal, Spontaneous      Baby Feeding: Breast Disposition:home with mother   08/17/2018 Clarene Duke, MD

## 2018-08-20 ENCOUNTER — Inpatient Hospital Stay
Admission: EM | Admit: 2018-08-20 | Discharge: 2018-08-22 | DRG: 776 | Disposition: A | Discharge status: Home / Self Care | Payer: 59 | Attending: Internal Medicine | Admitting: Internal Medicine

## 2018-08-20 ENCOUNTER — Encounter: Payer: Self-pay | Admitting: Emergency Medicine

## 2018-08-20 ENCOUNTER — Other Ambulatory Visit: Payer: Self-pay

## 2018-08-20 ENCOUNTER — Emergency Department: Payer: 59

## 2018-08-20 DIAGNOSIS — O9089 Other complications of the puerperium, not elsewhere classified: Secondary | ICD-10-CM | POA: Diagnosis present

## 2018-08-20 DIAGNOSIS — O9953 Diseases of the respiratory system complicating the puerperium: Secondary | ICD-10-CM | POA: Diagnosis present

## 2018-08-20 DIAGNOSIS — O8621 Infection of kidney following delivery: Secondary | ICD-10-CM | POA: Diagnosis present

## 2018-08-20 DIAGNOSIS — J45909 Unspecified asthma, uncomplicated: Secondary | ICD-10-CM | POA: Diagnosis present

## 2018-08-20 DIAGNOSIS — N3090 Cystitis, unspecified without hematuria: Secondary | ICD-10-CM

## 2018-08-20 DIAGNOSIS — O1415 Severe pre-eclampsia, complicating the puerperium: Secondary | ICD-10-CM | POA: Diagnosis present

## 2018-08-20 DIAGNOSIS — O8604 Sepsis following an obstetrical procedure: Secondary | ICD-10-CM | POA: Diagnosis present

## 2018-08-20 DIAGNOSIS — N12 Tubulo-interstitial nephritis, not specified as acute or chronic: Secondary | ICD-10-CM

## 2018-08-20 DIAGNOSIS — A4151 Sepsis due to Escherichia coli [E. coli]: Secondary | ICD-10-CM | POA: Diagnosis present

## 2018-08-20 DIAGNOSIS — Z87891 Personal history of nicotine dependence: Secondary | ICD-10-CM

## 2018-08-20 DIAGNOSIS — A419 Sepsis, unspecified organism: Secondary | ICD-10-CM | POA: Diagnosis present

## 2018-08-20 DIAGNOSIS — Z1159 Encounter for screening for other viral diseases: Secondary | ICD-10-CM | POA: Diagnosis not present

## 2018-08-20 LAB — URINALYSIS, ROUTINE W REFLEX MICROSCOPIC
Bilirubin Urine: NEGATIVE
Glucose, UA: NEGATIVE mg/dL
Ketones, ur: NEGATIVE mg/dL
Nitrite: NEGATIVE
Protein, ur: 100 mg/dL — AB
RBC / HPF: >50 RBC/hpf — ABNORMAL HIGH (ref 0–5)
Specific Gravity, Urine: 1.014 (ref 1.005–1.030)
WBC, UA: >50 WBC/hpf — ABNORMAL HIGH (ref 0–5)
pH: 6 (ref 5.0–8.0)

## 2018-08-20 LAB — COMPREHENSIVE METABOLIC PANEL
ALT: 95 U/L — ABNORMAL HIGH (ref 0–44)
AST: 48 U/L — ABNORMAL HIGH (ref 15–41)
Albumin: 2.5 g/dL — ABNORMAL LOW (ref 3.5–5.0)
Alkaline Phosphatase: 99 U/L (ref 38–126)
Anion gap: 10 (ref 5–15)
BUN: 22 mg/dL — ABNORMAL HIGH (ref 6–20)
CO2: 19 mmol/L — ABNORMAL LOW (ref 22–32)
Calcium: 7.9 mg/dL — ABNORMAL LOW (ref 8.9–10.3)
Chloride: 111 mmol/L (ref 98–111)
Creatinine, Ser: 0.82 mg/dL (ref 0.44–1.00)
GFR calc Af Amer: >60 mL/min (ref >60)
GFR calc non Af Amer: >60 mL/min (ref >60)
Glucose, Bld: 100 mg/dL — ABNORMAL HIGH (ref 70–99)
Potassium: 3.6 mmol/L (ref 3.5–5.1)
Sodium: 140 mmol/L (ref 135–145)
Total Bilirubin: 1 mg/dL (ref 0.3–1.2)
Total Protein: 5.9 g/dL — ABNORMAL LOW (ref 6.5–8.1)

## 2018-08-20 LAB — BLOOD CULTURE ID PANEL (REFLEXED)

## 2018-08-20 LAB — SARS CORONAVIRUS 2 BY RT PCR (HOSPITAL ORDER, PERFORMED IN ~~LOC~~ HOSPITAL LAB): SARS Coronavirus 2: NEGATIVE

## 2018-08-20 LAB — LACTIC ACID, PLASMA
Lactic Acid, Venous: 1.5 mmol/L (ref 0.5–1.9)
Lactic Acid, Venous: 0.8 mmol/L (ref 0.5–1.9)

## 2018-08-20 LAB — CBC WITH DIFFERENTIAL/PLATELET
Abs Immature Granulocytes: 0.08 10*3/uL — ABNORMAL HIGH (ref 0.00–0.07)
Basophils Absolute: 0 10*3/uL (ref 0.0–0.1)
Basophils Relative: 0 %
Eosinophils Absolute: 0 10*3/uL (ref 0.0–0.5)
Eosinophils Relative: 1 %
HCT: 32.7 % — ABNORMAL LOW (ref 36.0–46.0)
Hemoglobin: 10.7 g/dL — ABNORMAL LOW (ref 12.0–15.0)
Immature Granulocytes: 1 %
Lymphocytes Relative: 6 %
Lymphs Abs: 0.5 10*3/uL — ABNORMAL LOW (ref 0.7–4.0)
MCH: 28.5 pg (ref 26.0–34.0)
MCHC: 32.7 g/dL (ref 30.0–36.0)
MCV: 87 fL (ref 80.0–100.0)
Monocytes Absolute: 0.3 10*3/uL (ref 0.1–1.0)
Monocytes Relative: 4 %
Neutro Abs: 7.8 10*3/uL — ABNORMAL HIGH (ref 1.7–7.7)
Neutrophils Relative %: 88 %
Platelets: 145 10*3/uL — ABNORMAL LOW (ref 150–400)
RBC: 3.76 MIL/uL — ABNORMAL LOW (ref 3.87–5.11)
RDW: 14 % (ref 11.5–15.5)
WBC: 8.8 10*3/uL (ref 4.0–10.5)
nRBC: 0 % (ref 0.0–0.2)

## 2018-08-20 LAB — PROTIME-INR
INR: 1 (ref 0.8–1.2)
Prothrombin Time: 13.1 seconds (ref 11.4–15.2)

## 2018-08-20 LAB — TSH: TSH: 2.176 u[IU]/mL (ref 0.350–4.500)

## 2018-08-20 LAB — APTT: aPTT: 33 seconds (ref 24–36)

## 2018-08-20 MED ORDER — LABETALOL HCL 5 MG/ML IV SOLN
10.0000 mg | INTRAVENOUS | Status: DC | PRN
  Filled 2018-08-20: qty 4

## 2018-08-20 MED ORDER — BREAST MILK/FORMULA (FOR LABEL PRINTING ONLY): ORAL | Status: DC

## 2018-08-20 MED ORDER — ACETAMINOPHEN 500 MG PO TABS
1000.0000 mg | ORAL_TABLET | Freq: Once | ORAL | Status: AC
  Administered 2018-08-20: 1000 mg via ORAL
  Filled 2018-08-20: qty 2

## 2018-08-20 MED ORDER — SODIUM CHLORIDE 0.9 % IV SOLN
2.0000 g | Freq: Once | INTRAVENOUS | Status: AC
  Administered 2018-08-20: 08:00:00 2 g via INTRAVENOUS
  Filled 2018-08-20: qty 20

## 2018-08-20 MED ORDER — NIFEDIPINE ER OSMOTIC RELEASE 30 MG PO TB24
60.0000 mg | ORAL_TABLET | Freq: Every day | ORAL | Status: DC
  Administered 2018-08-20 – 2018-08-22 (×3): 60 mg via ORAL
  Filled 2018-08-20 (×3): qty 2

## 2018-08-20 MED ORDER — OXYCODONE HCL 5 MG PO TABS
5.0000 mg | ORAL_TABLET | Freq: Four times a day (QID) | ORAL | Status: DC | PRN
  Administered 2018-08-20 – 2018-08-21 (×3): 5 mg via ORAL
  Filled 2018-08-20 (×3): qty 1

## 2018-08-20 MED ORDER — SODIUM CHLORIDE 0.9 % IV SOLN
INTRAVENOUS | Status: DC
  Administered 2018-08-20 – 2018-08-22 (×5): via INTRAVENOUS

## 2018-08-20 MED ORDER — IOHEXOL 300 MG/ML  SOLN
100.0000 mL | Freq: Once | INTRAMUSCULAR | Status: AC | PRN
  Administered 2018-08-20: 10:00:00 100 mL via INTRAVENOUS

## 2018-08-20 MED ORDER — ONDANSETRON HCL 4 MG PO TABS: 4.0000 mg | ORAL_TABLET | Freq: Four times a day (QID) | ORAL | Status: DC | PRN

## 2018-08-20 MED ORDER — SODIUM CHLORIDE 0.9 % IV SOLN
1.0000 g | INTRAVENOUS | Status: DC
  Filled 2018-08-20: qty 10

## 2018-08-20 MED ORDER — SODIUM CHLORIDE 0.9 % IV BOLUS
1000.0000 mL | Freq: Once | INTRAVENOUS | Status: AC
  Administered 2018-08-20: 1000 mL via INTRAVENOUS

## 2018-08-20 MED ORDER — ONDANSETRON HCL 4 MG/2ML IJ SOLN: 4.0000 mg | Freq: Four times a day (QID) | INTRAMUSCULAR | Status: DC | PRN

## 2018-08-20 MED ORDER — MORPHINE SULFATE (PF) 4 MG/ML IV SOLN
4.0000 mg | Freq: Once | INTRAVENOUS | Status: AC
  Administered 2018-08-20: 4 mg via INTRAVENOUS
  Filled 2018-08-20: qty 1

## 2018-08-20 MED ORDER — ACETAMINOPHEN 325 MG PO TABS
650.0000 mg | ORAL_TABLET | Freq: Four times a day (QID) | ORAL | Status: DC | PRN
  Administered 2018-08-20 – 2018-08-21 (×3): 650 mg via ORAL
  Filled 2018-08-20 (×3): qty 2

## 2018-08-20 MED ORDER — IBUPROFEN 600 MG PO TABS
600.0000 mg | ORAL_TABLET | Freq: Four times a day (QID) | ORAL | Status: DC
  Administered 2018-08-20 – 2018-08-22 (×8): 600 mg via ORAL
  Filled 2018-08-20 (×8): qty 1

## 2018-08-20 MED ORDER — SODIUM CHLORIDE 0.9 % IV SOLN
2.0000 g | INTRAVENOUS | Status: DC
  Filled 2018-08-20: qty 20

## 2018-08-20 MED ORDER — SODIUM CHLORIDE 0.9 % IV SOLN
1.0000 g | Freq: Three times a day (TID) | INTRAVENOUS | Status: DC
  Administered 2018-08-20 – 2018-08-22 (×5): 1 g via INTRAVENOUS
  Filled 2018-08-20 (×8): qty 1

## 2018-08-20 MED ORDER — POLYETHYLENE GLYCOL 3350 17 G PO PACK
17.0000 g | PACK | Freq: Every day | ORAL | Status: DC | PRN
  Filled 2018-08-20: qty 1

## 2018-08-20 MED ORDER — ONDANSETRON HCL 4 MG/2ML IJ SOLN
4.0000 mg | Freq: Once | INTRAMUSCULAR | Status: AC
  Administered 2018-08-20: 08:00:00 4 mg via INTRAVENOUS
  Filled 2018-08-20: qty 2

## 2018-08-20 MED ORDER — PRENATAL MULTIVITAMIN CH
1.0000 | ORAL_TABLET | Freq: Every day | ORAL | Status: DC
  Administered 2018-08-21 – 2018-08-22 (×2): 1 via ORAL
  Filled 2018-08-20 (×2): qty 1

## 2018-08-20 MED ORDER — ALBUTEROL SULFATE (2.5 MG/3ML) 0.083% IN NEBU: 3.0000 mL | INHALATION_SOLUTION | Freq: Four times a day (QID) | RESPIRATORY_TRACT | Status: DC | PRN

## 2018-08-20 MED ORDER — ENOXAPARIN SODIUM 40 MG/0.4ML ~~LOC~~ SOLN
40.0000 mg | Freq: Two times a day (BID) | SUBCUTANEOUS | Status: DC
  Administered 2018-08-20 – 2018-08-22 (×4): 40 mg via SUBCUTANEOUS
  Filled 2018-08-20 (×5): qty 0.4

## 2018-08-20 MED ORDER — MOMETASONE FURO-FORMOTEROL FUM 200-5 MCG/ACT IN AERO
2.0000 | INHALATION_SPRAY | Freq: Two times a day (BID) | RESPIRATORY_TRACT | Status: DC
  Administered 2018-08-20 – 2018-08-22 (×4): 2 via RESPIRATORY_TRACT
  Filled 2018-08-20: qty 8.8

## 2018-08-20 NOTE — Consult Note (Signed)
Pharmacy Antibiotic Note  Sara Wolf is a 33 y.o. female admitted on 08/20/2018 with pyelonephritis and bacteremia .  Pharmacy has been consulted for Meropenem dosing.  Plan: Meropenem 1g q8h ber BCID protocol (see BCID note)  Height: 5\' 2"  (157.5 cm) Weight: 245 lb (111.1 kg) IBW/kg (Calculated) : 50.1  Temp (24hrs), Avg:100.2 F (37.9 C), Min:99.3 F (37.4 C), Max:102.5 F (39.2 C)  Recent Labs  Lab 08/14/18 1514 08/15/18 0634 08/15/18 1352 08/16/18 0655 08/20/18 0637 08/20/18 0921  WBC 10.8* 15.2* 15.1* 10.9* 8.8  --   CREATININE 1.06*  --  0.77 0.93 0.82  --   LATICACIDVEN  --   --   --   --  1.5 0.8    Estimated Creatinine Clearance: 115.8 mL/min (by C-G formula based on SCr of 0.82 mg/dL).    Allergies  Allergen Reactions  . Apple   . Peach [Prunus Persica]   . Pear     Antimicrobials this admission: 7/19 Ceftriaxone x 1  7/19 Meropenem >>   Dose adjustments this admission: None  Microbiology results: 7/19 BCx: BCID E Coli, 2/4 Anaerobic KPC not detected 7/19 UCx pending  Thank you for allowing pharmacy to be a part of this patient's care.  Lu Duffel, PharmD, BCPS Clinical Pharmacist 08/20/2018 4:36 PM

## 2018-08-20 NOTE — Progress Notes (Signed)
PHARMACY - PHYSICIAN COMMUNICATION CRITICAL VALUE ALERT - BLOOD CULTURE IDENTIFICATION (BCID)  Sara Wolf is an 33 y.o. female who presented to [facility] on 08/20/2018 with a chief complaint of tremors, fever, and R-sided pyelonephritis.  Assessment:  2/4 bottle - Anaerobic - E Coli - KPC not detected (suspected source UTI)  Name of physician (or Provider) Contacted: Hyman Bible  Current antibiotics: Ceftriaxone 2g q24h  Changes to prescribed antibiotics recommended: Meropenem 1g q8h Recommendations accepted by provider  Results for orders placed or performed during the hospital encounter of 08/20/18  Blood Culture ID Panel (Reflexed) (Collected: 08/20/2018  6:37 AM)  Result Value Ref Range   Enterococcus species NOT DETECTED NOT DETECTED   Listeria monocytogenes NOT DETECTED NOT DETECTED   Staphylococcus species NOT DETECTED NOT DETECTED   Staphylococcus aureus (BCID) NOT DETECTED NOT DETECTED   Streptococcus species NOT DETECTED NOT DETECTED   Streptococcus agalactiae NOT DETECTED NOT DETECTED   Streptococcus pneumoniae NOT DETECTED NOT DETECTED   Streptococcus pyogenes NOT DETECTED NOT DETECTED   Acinetobacter baumannii NOT DETECTED NOT DETECTED   Enterobacteriaceae species DETECTED (A) NOT DETECTED   Enterobacter cloacae complex NOT DETECTED NOT DETECTED   Escherichia coli DETECTED (A) NOT DETECTED   Klebsiella oxytoca NOT DETECTED NOT DETECTED   Klebsiella pneumoniae NOT DETECTED NOT DETECTED   Proteus species NOT DETECTED NOT DETECTED   Serratia marcescens NOT DETECTED NOT DETECTED   Carbapenem resistance NOT DETECTED NOT DETECTED   Haemophilus influenzae NOT DETECTED NOT DETECTED   Neisseria meningitidis NOT DETECTED NOT DETECTED   Pseudomonas aeruginosa NOT DETECTED NOT DETECTED   Candida albicans NOT DETECTED NOT DETECTED   Candida glabrata NOT DETECTED NOT DETECTED   Candida krusei NOT DETECTED NOT DETECTED   Candida parapsilosis NOT DETECTED NOT DETECTED   Candida tropicalis NOT DETECTED NOT [place], PharmD, BCPS Clinical Pharmacist 08/20/2018 4:33 PM

## 2018-08-20 NOTE — Plan of Care (Signed)
Patient is alert and oriented with pleasant affect. Color good, skin w&d. BBS clear. Pt. States she feels, "Better." Afebrile at this time, Continues sl. Tachycardic, RR even and unlabored, B/P WNL.  Lactic Acid was WNL at 0.8. Pt. Does have E Coli in Blood Culture. Dr. Dietrich [place] Paged and on return Phone call, I asked if Pt. Needed to be on Contact Isolation for The E-Coli in Blood Culture since Urine Culture is Pending. He stated," No." WBC is 8.8. Pt. Is to have repeat BMP and CBC in Morning. Pt. Denies c/o, appears comfortable and in NAD at this time.

## 2018-08-20 NOTE — ED Provider Notes (Signed)
[facility] Emergency Department Provider Note  ____________________________________________  Time seen: Approximately 8:04 AM  I have reviewed the triage vital signs and the nursing notes.   HISTORY  Chief Complaint Tremors and Fever    HPI Sara Wolf is a 33 y.o. female with a history of asthma, obesity, and vaginal delivery 5 days ago and pregnancy complicated by preeclampsia, who presents with right flank pain since last night when the patient began having fever and chills.  Flank pain is severe, 7/10, nonradiating, no aggravating or alleviating factors.  Denies dysuria.  No unusual vaginal bleeding or discharge.  No chest pain shortness of breath body aches cough sore throat.   Denies breast pain     Past Medical History:  Diagnosis Date  . Asthma   . Complication of anesthesia    'took me a while to wake up'  . Obesity (BMI 30-39.9)      Patient Active Problem List   Diagnosis Date Noted  . Severe preeclampsia, third trimester 08/15/2018  . Severe preeclampsia 08/14/2018     Past Surgical History:  Procedure Laterality Date  . BREAST REDUCTION SURGERY       Prior to Admission medications   Medication Sig Start Date End Date Taking? Authorizing Provider  acetaminophen (TYLENOL) 325 MG tablet Take 650 mg by mouth every 6 (six) hours as needed for mild pain or fever.    Yes [provider]  albuterol (VENTOLIN HFA) 108 (90 Base) MCG/ACT inhaler Inhale 2 puffs into the lungs every 6 (six) hours as needed for wheezing or shortness of breath.    Yes [provider]  calcium carbonate (TUMS - DOSED IN MG ELEMENTAL CALCIUM) 500 MG chewable tablet Chew 1 tablet by mouth daily.   Yes [provider]  Fluticasone-Salmeterol (ADVAIR) 250-50 MCG/DOSE AEPB Inhale 1 puff into the lungs 2 (two) times daily.   Yes [provider]  ibuprofen (ADVIL) 600 MG tablet Take 1 tablet (600 mg total) by mouth every 6 (six)  hours. 08/17/18  Yes Meisinger, Sherren Mocha, MD  NIFEdipine (ADALAT CC) 60 MG 24 hr tablet Take 1 tablet (60 mg total) by mouth daily. 08/18/18  Yes Meisinger, Todd, MD  Prenatal Vit-Fe Fumarate-FA (PRENATAL MULTIVITAMIN) TABS tablet Take 1 tablet by mouth daily.    Yes [provider]     Allergies Apple, Peach [prunus persica], and Pear   Family History  Problem Relation Age of Onset  . Healthy Mother   . Hypertension Father   . Asthma Father   . Hyperlipidemia Father   . Stroke Maternal Grandmother   . Diabetes Maternal Grandfather   . Cancer Paternal Grandfather     Social History Social History   Tobacco Use  . Smoking status: Former Research scientist (life sciences)  . Smokeless tobacco: Never Used  Substance Use Topics  . Alcohol use: Not Currently  . Drug use: Never    Review of Systems  Constitutional: Positive fever and chills.  ENT:   No sore throat. No rhinorrhea. Cardiovascular:   No chest pain or syncope. Respiratory:   No dyspnea or cough. Gastrointestinal: Positive right flank pain without vomiting and diarrhea.  Musculoskeletal:   Negative for focal pain or swelling All other systems reviewed and are negative except as documented above in ROS and HPI.  ____________________________________________   PHYSICAL EXAM:  VITAL SIGNS: ED Triage Vitals  Enc Vitals Group     BP 08/20/18 0638 122/64     Pulse Rate 08/20/18 0638 (!) 153  Resp 08/20/18 0638 (!) 24     Temp 08/20/18 0638 (!) 102.5 F (39.2 C)     Temp Source 08/20/18 0638 Oral     SpO2 08/20/18 0634 99 %     Weight 08/20/18 0637 245 lb (111.1 kg)     Height 08/20/18 0637 5\' 2"  (1.575 m)     Head Circumference --      Peak Flow --      Pain Score 08/20/18 0638 7     Pain Loc --      Pain Edu? --      Excl. in GC? --     Vital signs reviewed, nursing assessments reviewed.   Constitutional:   Alert and oriented.  Ill-appearing Eyes:   Conjunctivae are normal. EOMI. PERRL. ENT      Head:    Normocephalic and atraumatic.      Nose:   No congestion/rhinnorhea.       Mouth/Throat:   MMM, no pharyngeal erythema. No peritonsillar mass.       Neck:   No meningismus. Full ROM. Hematological/Lymphatic/Immunilogical:   No cervical lymphadenopathy. Cardiovascular:   Tachycardia heart rate 155. Symmetric bilateral radial and DP pulses.  No murmurs. Cap refill brisk, less than 2 seconds. Respiratory:   Normal respiratory effort without tachypnea/retractions. Breath sounds are clear and equal bilaterally. No wheezes/rales/rhonchi. Gastrointestinal:   Soft with right upper quadrant/right flank tenderness. Non distended. There is no CVA tenderness.  No rebound, rigidity, or guarding. Musculoskeletal:   Normal range of motion in all extremities. No joint effusions.  No lower extremity tenderness.  No edema. Neurologic:   Normal speech and language.  Motor grossly intact. No acute focal neurologic deficits are appreciated.  Skin:    Skin is warm, dry and intact. No rash noted.  No petechiae, purpura, or bullae.  ____________________________________________    LABS (pertinent positives/negatives) (all labs ordered are listed, but only abnormal results are displayed) Labs Reviewed  COMPREHENSIVE METABOLIC PANEL - Abnormal; Notable for the following components:      Result Value   CO2 19 (*)    Glucose, Bld 100 (*)    BUN 22 (*)    Calcium 7.9 (*)    Total Protein 5.9 (*)    Albumin 2.5 (*)    AST 48 (*)    ALT 95 (*)    All other components within normal limits  CBC WITH DIFFERENTIAL/PLATELET - Abnormal; Notable for the following components:   RBC 3.76 (*)    Hemoglobin 10.7 (*)    HCT 32.7 (*)    Platelets 145 (*)    Neutro Abs 7.8 (*)    Lymphs Abs 0.5 (*)    Abs Immature Granulocytes 0.08 (*)    All other components within normal limits  URINALYSIS, ROUTINE W REFLEX MICROSCOPIC - Abnormal; Notable for the following components:   Color, Urine YELLOW (*)    APPearance CLOUDY  (*)    Hgb urine dipstick LARGE (*)    Protein, ur 100 (*)    Leukocytes,Ua LARGE (*)    RBC / HPF >50 (*)    WBC, UA >50 (*)    Bacteria, UA FEW (*)    All other components within normal limits  SARS CORONAVIRUS 2 (HOSPITAL ORDER, PERFORMED IN [facility])  CULTURE, BLOOD (ROUTINE X 2)  CULTURE, BLOOD (ROUTINE X 2)  URINE CULTURE  LACTIC ACID, PLASMA  LACTIC ACID, PLASMA  APTT  PROTIME-INR   ____________________________________________   EKG  Interpreted by me Sinus tachycardia rate 154, normal axis and intervals.  Normal QRS ST segments and T waves.  ____________________________________________    RADIOLOGY  Ct Abdomen Pelvis W Contrast  Result Date: 08/20/2018 CLINICAL DATA:  Vaginal delivery on 08/15/2018.  Fever and chills. EXAM: CT ABDOMEN AND PELVIS WITH CONTRAST TECHNIQUE: Multidetector CT imaging of the abdomen and pelvis was performed using the standard protocol following bolus administration of intravenous contrast. CONTRAST:  OMNIPAQUE IOHEXOL 300 MG/ML  SOLN COMPARISON:  Ultrasound same day FINDINGS: Lower chest: Normal Hepatobiliary: Normal Pancreas: Normal Spleen: Normal Adrenals/Urinary Tract: Adrenal glands are normal. Left kidney is normal. Right kidney shows swelling and surrounding edema with areas of relatively poor function most consistent with pyelonephritis. Mild fullness of the renal collecting system, consistent with recent pregnancy. No sign of stone disease. Bladder appears normal. Stomach/Bowel: Normal Vascular/Lymphatic: Normal Reproductive: Uterus is enlarged consistent with recent childbirth. No unexpected finding. No adnexal lesion. Other: No free fluid or air. Musculoskeletal: Normal IMPRESSION: The right kidney is enlarged and shows surrounding edema and areas of relatively poor function, consistent with right pyelonephritis. Mild fullness of the renal collecting system consistent with recent pregnancy but no sign of obstruction.  Electronically Signed   By: Paulina Fusi M.D.   On: 08/20/2018 10:20   US Renal  Result Date: 08/20/2018 CLINICAL DATA:  Right upper quadrant abdominal pain. EXAM: RENAL / URINARY TRACT ULTRASOUND COMPLETE COMPARISON:  None. FINDINGS: Right Kidney: Renal measurements: 12.9 x 7.4 x 6.7 cm = volume: 340 mL . Echogenicity within normal limits. No mass visualized. Possible minimal right hydronephrosis is noted. Left Kidney: Renal measurements: 13.9 x 6.7 x 5.2 cm = volume: 256 mL. Echogenicity within normal limits. No mass or hydronephrosis visualized. Bladder: Appears normal for degree of bladder distention. IMPRESSION: Possible minimal right hydronephrosis. Electronically Signed   By: Lupita Raider M.D.   On: 08/20/2018 09:24   Dg Chest Port 1 View  Result Date: 08/20/2018 CLINICAL DATA:  Initial evaluation for acute postpartum fever. EXAM: PORTABLE CHEST 1 VIEW COMPARISON:  None available. FINDINGS: Cardiac and mediastinal silhouettes are within normal limits. Lungs mildly hypoinflated. No focal infiltrates. No pulmonary edema or pleural effusion. No pneumothorax. No acute osseous finding. IMPRESSION: No radiographic evidence for active cardiopulmonary disease. Electronically Signed   By: Rise Mu M.D.   On: 08/20/2018 07:11   US Abdomen Limited Ruq  Result Date: 08/20/2018 CLINICAL DATA:  Right upper quadrant abdominal pain. EXAM: ULTRASOUND ABDOMEN LIMITED RIGHT UPPER QUADRANT COMPARISON:  None. FINDINGS: Gallbladder: No gallstones or wall thickening visualized. No sonographic Murphy sign noted by sonographer. Common bile duct: Diameter: 4 mm which is within normal limits. Liver: No focal lesion identified. Within normal limits in parenchymal echogenicity. Portal vein is patent on color Doppler imaging with normal direction of blood flow towards the liver. IMPRESSION: No abnormality seen in the right upper quadrant of the abdomen. Electronically Signed   By: Lupita Raider M.D.   On:  08/20/2018 09:26    ____________________________________________   PROCEDURES .Critical Care Performed by: Sharman Cheek, MD Authorized by: Sharman Cheek, MD   Critical care provider statement:    Critical care time (minutes):  35   Critical care time was exclusive of:  Separately billable procedures and treating other patients   Critical care was necessary to treat or prevent imminent or life-threatening deterioration of the following conditions:  Sepsis   Critical care was time spent personally by me on the following activities:  Development of treatment plan with patient or surrogate, discussions with consultants, evaluation of patient's response to treatment, examination of patient, obtaining history from patient or surrogate, ordering and performing treatments and interventions, ordering and review of laboratory studies, ordering and review of radiographic studies, pulse oximetry, re-evaluation of patient's condition and review of old charts    ____________________________________________  DIFFERENTIAL DIAGNOSIS   Ureterolithiasis, pyelonephritis/cystitis, cholecystitis, appendicitis.  Less likely retained POC's/endometritis, uterine perforation, ovarian cyst, bowel perforation  CLINICAL IMPRESSION / ASSESSMENT AND PLAN / ED COURSE  Medications ordered in the ED: Medications  sodium chloride 0.9 % bolus 1,000 mL (0 mLs Intravenous Stopped 08/20/18 0758)  acetaminophen (TYLENOL) tablet 1,000 mg (1,000 mg Oral Given 08/20/18 0719)  cefTRIAXone (ROCEPHIN) 2 g in sodium chloride 0.9 % 100 mL IVPB (0 g Intravenous Stopped 08/20/18 0838)  morphine 4 MG/ML injection 4 mg (4 mg Intravenous Given 08/20/18 0803)  ondansetron (ZOFRAN) injection 4 mg (4 mg Intravenous Given 08/20/18 0802)  sodium chloride 0.9 % bolus 1,000 mL (0 mLs Intravenous Stopped 08/20/18 0920)  sodium chloride 0.9 % bolus 1,000 mL (0 mLs Intravenous Stopped 08/20/18 0920)  iohexol (OMNIPAQUE) 300 MG/ML solution  100 mL (100 mLs Intravenous Contrast Given 08/20/18 0955)    Pertinent labs & imaging results that were available during my care of the patient were reviewed by me and considered in my medical decision making (see chart for details).  Loleta BooksDesiree Wolf was evaluated in Emergency Department on 08/20/2018 for the symptoms described in the history of present illness. She was evaluated in the context of the global COVID-19 pandemic, which necessitated consideration that the patient might be at risk for infection with the SARS-CoV-2 virus that causes COVID-19. Institutional protocols and algorithms that pertain to the evaluation of patients at risk for COVID-19 are in a state of rapid change based on information released by regulatory bodies including the CDC and federal and state organizations. These policies and algorithms were followed during the patient's care in the ED.     Clinical Course as of Aug 20 1054  Sun Aug 20, 2018  0750 Patient seen at the beginning of my shift, presents with fever tachycardia right flank pain.  Fever significantly elevated at 102.5, very tachycardic at 155.  She is ill-appearing although not in distress.  No signs of shock.  Labs are reassuring with urine consistent with UTI.  Possible pyelonephritis plus minus ureterolithiasis or cholecystitis.  Will get ultrasounds, give morphine Zofran and IV fluids.  Start ceftriaxone 2 g IV.  Code sepsis initiated.   [PS]  0930 Ultrasounds unremarkable.  Will need to proceed with CT scan of the abdomen pelvis to further evaluate possible causes of the patient's severe abdominal pain, fever, tachycardia.   [PS]  1051 CT shows right-sided pyelonephritis consistent with infected urinalysis and exam.  With patient being septic from this and still persistently tachycardic despite aggressive IV fluids, she will need to be hospitalized for continued IV antibiotics.  Patient is agreeable to this and understands her infant will not be able to  visit during this time and will likely need to be formula fed at home.   [PS]    Clinical Course User Index [PS] Sharman CheekStafford, Honor Fairbank, MD     ____________________________________________   FINAL CLINICAL IMPRESSION(S) / ED DIAGNOSES    Final diagnoses:  Pyelonephritis of right kidney  Cystitis  Sepsis, due to unspecified organism, unspecified whether acute organ dysfunction present [facility](HCC)     ED Discharge Orders    None  Portions of this note were generated with dragon dictation software. Dictation errors may occur despite best attempts at proofreading.   Sharman CheekStafford, Johm Pfannenstiel, MD 08/20/18 1056

## 2018-08-20 NOTE — ED Notes (Addendum)
Report given to Susan, RN

## 2018-08-20 NOTE — ED Notes (Signed)
US at bedside

## 2018-08-20 NOTE — ED Notes (Signed)
ED TO INPATIENT HANDOFF REPORT  ED Nurse Name and Phone #: Marvin Maenza [phone]  S Name/Age/Gender Sara Wolf 33 y.o. female Room/Bed: ED03A/ED03A  Code Status   Code Status: Prior  Home/SNF/Other Home Patient oriented to: self, place, time and situation Is this baseline? Yes   Triage Complete: Triage complete  Chief Complaint Fever  Triage Note Patient arrives from home with fever and chills s/p vaginal delivery of baby girl 7/14. This is patient's first baby, no complications with delivery, dx with preeclampsia and was induced. Saturday night began experiencing tremors and chills, called OBGYN who told her it could be "her breast milk coming in, she should continue to pump and see if the fever goes down". Patient initially 180 ST with EMS. Patient had some stitches placed for vaginal tearing   Allergies Allergies  Allergen Reactions  . Apple   . Peach [Prunus Persica]   . Pear     Level of Care/Admitting Diagnosis ED Disposition    ED Disposition Condition Comment   Admit  Hospital Area: [facility] [[id number]]  Level of Care: Med-Surg [16]  Covid Evaluation: Asymptomatic Screening Protocol (No Symptoms)  Diagnosis: Sepsis [facility](HCC) [[id number]]) [[id number]]  Admitting Physician: Willadean CarolMAYO, KATY DODD [[id number]]  Attending Physician: Willadean CarolMAYO, KATY DODD [[id number]]  Estimated length of stay: past midnight tomorrow  Certification:: I certify this patient will need inpatient services for at least 2 midnights  PT Class (Do Not Modify): Inpatient [101]  PT Acc Code (Do Not Modify): Private [1]       B Medical/Surgery History Past Medical History:  Diagnosis Date  . Asthma   . Complication of anesthesia    'took me a while to wake up'  . Obesity (BMI 30-39.9)    Past Surgical History:  Procedure Laterality Date  . BREAST REDUCTION SURGERY       A IV Location/Drains/Wounds Patient Lines/Drains/Airways Status   Active Line/Drains/Airways    Name:   Placement date:    Placement time:   Site:   Days:   Peripheral IV 08/20/18 Right Antecubital   08/20/18    -    Antecubital   less than 1   Peripheral IV 08/20/18 Left Antecubital   08/20/18    0636    Antecubital   less than 1          Intake/Output Last 24 hours  Intake/Output Summary (Last 24 hours) at 08/20/2018 1149 Last data filed at 08/20/2018 0920 Gross per 24 hour  Intake 3100 ml  Output -  Net 3100 ml    Labs/Imaging Results for orders placed or performed during the hospital encounter of 08/20/18 (from the past 48 hour(s))  Lactic acid, plasma     Status: None   Collection Time: 08/20/18  6:37 AM  Result Value Ref Range   Lactic Acid, Venous 1.5 0.5 - 1.9 mmol/L    Comment: Performed at [facility], [address] [place], [place] [address]  Comprehensive metabolic panel     Status: Abnormal   Collection Time: 08/20/18  6:37 AM  Result Value Ref Range   Sodium 140 135 - 145 mmol/L   Potassium 3.6 3.5 - 5.1 mmol/L   Chloride 111 98 - 111 mmol/L   CO2 19 (L) 22 - 32 mmol/L   Glucose, Bld 100 (H) 70 - 99 mg/dL   BUN 22 (H) 6 - 20 mg/dL   Creatinine, Ser 3.080.82 0.44 - 1.00 mg/dL   Calcium 7.9 (L) 8.9 - 10.3 mg/dL  Total Protein 5.9 (L) 6.5 - 8.1 g/dL   Albumin 2.5 (L) 3.5 - 5.0 g/dL   AST 48 (H) 15 - 41 U/L   ALT 95 (H) 0 - 44 U/L   Alkaline Phosphatase 99 38 - 126 U/L   Total Bilirubin 1.0 0.3 - 1.2 mg/dL   GFR calc non Af Amer >60 >60 mL/min   GFR calc Af Amer >60 >60 mL/min   Anion gap 10 5 - 15    Comment: Performed at [facility], [address] [place], [place] [address]  CBC WITH DIFFERENTIAL     Status: Abnormal   Collection Time: 08/20/18  6:37 AM  Result Value Ref Range   WBC 8.8 4.0 - 10.5 K/uL   RBC 3.76 (L) 3.87 - 5.11 MIL/uL   Hemoglobin 10.7 (L) 12.0 - 15.0 g/dL   HCT 32.7 (L) 36.0 - 46.0 %   MCV 87.0 80.0 - 100.0 fL   MCH 28.5 26.0 - 34.0 pg   MCHC 32.7 30.0 - 36.0 g/dL   RDW 14.0 11.5 - 15.5 %   Platelets 145 (L) 150 - 400 K/uL    nRBC 0.0 0.0 - 0.2 %   Neutrophils Relative % 88 %   Neutro Abs 7.8 (H) 1.7 - 7.7 K/uL   Lymphocytes Relative 6 %   Lymphs Abs 0.5 (L) 0.7 - 4.0 K/uL   Monocytes Relative 4 %   Monocytes Absolute 0.3 0.1 - 1.0 K/uL   Eosinophils Relative 1 %   Eosinophils Absolute 0.0 0.0 - 0.5 K/uL   Basophils Relative 0 %   Basophils Absolute 0.0 0.0 - 0.1 K/uL   Immature Granulocytes 1 %   Abs Immature Granulocytes 0.08 (H) 0.00 - 0.07 K/uL    Comment: Performed at [facility], [address] [place], [place] [address]  APTT     Status: None   Collection Time: 08/20/18  6:37 AM  Result Value Ref Range   aPTT 33 24 - 36 seconds    Comment: Performed at [facility], [address] [place], [place] [address]  Protime-INR     Status: None   Collection Time: 08/20/18  6:37 AM  Result Value Ref Range   Prothrombin Time 13.1 11.4 - 15.2 seconds   INR 1.0 0.8 - 1.2    Comment: (NOTE) INR goal varies based on device and disease states. Performed at [facility], [address] [place], [place] [address]   Urinalysis, Routine w reflex microscopic     Status: Abnormal   Collection Time: 08/20/18  7:04 AM  Result Value Ref Range   Color, Urine YELLOW (A) YELLOW   APPearance CLOUDY (A) CLEAR   Specific Gravity, Urine 1.014 1.005 - 1.030   pH 6.0 5.0 - 8.0   Glucose, UA NEGATIVE NEGATIVE mg/dL   Hgb urine dipstick LARGE (A) NEGATIVE   Bilirubin Urine NEGATIVE NEGATIVE   Ketones, ur NEGATIVE NEGATIVE mg/dL   Protein, ur 100 (A) NEGATIVE mg/dL   Nitrite NEGATIVE NEGATIVE   Leukocytes,Ua LARGE (A) NEGATIVE   RBC / HPF >50 (H) 0 - 5 RBC/hpf   WBC, UA >50 (H) 0 - 5 WBC/hpf   Bacteria, UA FEW (A) NONE SEEN   Squamous Epithelial / LPF 11-20 0 - 5   WBC Clumps PRESENT    Mucus PRESENT     Comment: Performed at [facility], [address], [place],  [address]  SARS Coronavirus 2 (CEPHEID - Performed in [facility] hospital lab), Hosp Order     Status:  None   Collection Time: 08/20/18  7:04 AM   Specimen: Nasopharyngeal Swab  Result Value Ref Range   SARS Coronavirus 2 NEGATIVE NEGATIVE    Comment: (NOTE) If result is NEGATIVE SARS-CoV-2 target nucleic acids are NOT DETECTED. The SARS-CoV-2 RNA is generally detectable in upper and lower  respiratory specimens during the acute phase of infection. The lowest  concentration of SARS-CoV-2 viral copies this assay can detect is 250  copies / mL. A negative result does not preclude SARS-CoV-2 infection  and should not be used as the sole basis for treatment or other  patient management decisions.  A negative result may occur with  improper specimen collection / handling, submission of specimen other  than nasopharyngeal swab, presence of viral mutation(s) within the  areas targeted by this assay, and inadequate number of viral copies  (<250 copies / mL). A negative result must be combined with clinical  observations, patient history, and epidemiological information. If result is POSITIVE SARS-CoV-2 target nucleic acids are DETECTED. The SARS-CoV-2 RNA is generally detectable in upper and lower  respiratory specimens dur ing the acute phase of infection.  Positive  results are indicative of active infection with SARS-CoV-2.  Clinical  correlation with patient history and other diagnostic information is  necessary to determine patient infection status.  Positive results do  not rule out bacterial infection or co-infection with other viruses. If result is PRESUMPTIVE POSTIVE SARS-CoV-2 nucleic acids MAY BE PRESENT.   A presumptive positive result was obtained on the submitted specimen  and confirmed on repeat testing.  While 2019 novel coronavirus  (SARS-CoV-2) nucleic acids may be present in the submitted sample  additional confirmatory testing may be necessary for epidemiological  and / or clinical management purposes  to differentiate between  SARS-CoV-2 and other Sarbecovirus currently  known to infect humans.  If clinically indicated additional testing with an alternate test  methodology [phone]) is advised. The SARS-CoV-2 RNA is generally  detectable in upper and lower respiratory sp ecimens during the acute  phase of infection. The expected result is Negative. Fact Sheet for Patients:  BoilerBrush.com.cy Fact Sheet for Healthcare Providers: https://pope.com/ This test is not yet approved or cleared by the [place] FDA and has been authorized for detection and/or diagnosis of SARS-CoV-2 by FDA under an Emergency Use Authorization (EUA).  This EUA will remain in effect (meaning this test can be used) for the duration of the COVID-19 declaration under Section 564(b)(1) of the Act, 21 U.S.C. section 360bbb-3(b)(1), unless the authorization is terminated or revoked sooner. Performed at [facility], [address] [place], [place] [address]   Lactic acid, plasma     Status: None   Collection Time: 08/20/18  9:21 AM  Result Value Ref Range   Lactic Acid, Venous 0.8 0.5 - 1.9 mmol/L    Comment: Performed at [facility], [address] [place], [place] [address]   Ct Abdomen Pelvis W Contrast  Result Date: 08/20/2018 CLINICAL DATA:  Vaginal delivery on 08/15/2018.  Fever and chills. EXAM: CT ABDOMEN AND PELVIS WITH CONTRAST TECHNIQUE: Multidetector CT imaging of the abdomen and pelvis was performed using the standard protocol following bolus administration of intravenous contrast. CONTRAST:  OMNIPAQUE IOHEXOL 300 MG/ML  SOLN COMPARISON:  Ultrasound same day FINDINGS: Lower chest: Normal Hepatobiliary: Normal Pancreas: Normal Spleen: Normal Adrenals/Urinary Tract: Adrenal glands are normal. Left kidney is normal. Right kidney shows swelling and surrounding  edema with areas of relatively poor function most consistent with pyelonephritis. Mild fullness of the renal collecting system, consistent  with recent pregnancy. No sign of stone disease. Bladder appears normal. Stomach/Bowel: Normal Vascular/Lymphatic: Normal Reproductive: Uterus is enlarged consistent with recent childbirth. No unexpected finding. No adnexal lesion. Other: No free fluid or air. Musculoskeletal: Normal IMPRESSION: The right kidney is enlarged and shows surrounding edema and areas of relatively poor function, consistent with right pyelonephritis. Mild fullness of the renal collecting system consistent with recent pregnancy but no sign of obstruction. Electronically Signed   By: Paulina FusiMark  Shogry M.D.   On: 08/20/2018 10:20   [place] Renal  Result Date: 08/20/2018 CLINICAL DATA:  Right upper quadrant abdominal pain. EXAM: RENAL / URINARY TRACT ULTRASOUND COMPLETE COMPARISON:  None. FINDINGS: Right Kidney: Renal measurements: 12.9 x 7.4 x 6.7 cm = volume: 340 mL . Echogenicity within normal limits. No mass visualized. Possible minimal right hydronephrosis is noted. Left Kidney: Renal measurements: 13.9 x 6.7 x 5.2 cm = volume: 256 mL. Echogenicity within normal limits. No mass or hydronephrosis visualized. Bladder: Appears normal for degree of bladder distention. IMPRESSION: Possible minimal right hydronephrosis. Electronically Signed   By: Lupita RaiderJames  Green Jr M.D.   On: 08/20/2018 09:24   Dg Chest Port 1 View  Result Date: 08/20/2018 CLINICAL DATA:  Initial evaluation for acute postpartum fever. EXAM: PORTABLE CHEST 1 VIEW COMPARISON:  None available. FINDINGS: Cardiac and mediastinal silhouettes are within normal limits. Lungs mildly hypoinflated. No focal infiltrates. No pulmonary edema or pleural effusion. No pneumothorax. No acute osseous finding. IMPRESSION: No radiographic evidence for active cardiopulmonary disease. Electronically Signed   By: Rise MuBenjamin  McClintock M.D.   On: 08/20/2018 07:11   [place] Abdomen Limited Ruq  Result Date: 08/20/2018 CLINICAL DATA:  Right upper quadrant abdominal pain. EXAM: ULTRASOUND ABDOMEN LIMITED RIGHT  UPPER QUADRANT COMPARISON:  None. FINDINGS: Gallbladder: No gallstones or wall thickening visualized. No sonographic Murphy sign noted by sonographer. Common bile duct: Diameter: 4 mm which is within normal limits. Liver: No focal lesion identified. Within normal limits in parenchymal echogenicity. Portal vein is patent on color Doppler imaging with normal direction of blood flow towards the liver. IMPRESSION: No abnormality seen in the right upper quadrant of the abdomen. Electronically Signed   By: Lupita RaiderJames  Green Jr M.D.   On: 08/20/2018 09:26    Pending Labs Unresulted Labs (From admission, onward)    Start     Ordered   08/20/18 0643  Blood Culture (routine x 2)  BLOOD CULTURE X 2,   STAT     08/20/18 [id number]   08/20/18 0643  Urine culture  ONCE - STAT,   STAT     08/20/18 [id number]   Signed and Held  HIV antibody (Routine Testing)  Once,   R     Signed and Held   Signed and Held  Basic metabolic panel  Tomorrow morning,   R     Signed and Held   Signed and Held  CBC  Tomorrow morning,   R     Signed and Held   Signed and Held  TSH  Add-on,   R     Signed and Held          Vitals/Pain Today's Vitals   08/20/18 0941 08/20/18 1030 08/20/18 1100 08/20/18 1130  BP:  129/85 140/90 128/80  Pulse:  (!) 127 (!) 122 (!) 118  Resp:  (!) 26 14 14   Temp: 99.3 F (37.4 C)  TempSrc: Oral     SpO2:  95% 99% 98%  Weight:      Height:      PainSc:        Isolation Precautions No active isolations  Medications Medications  sodium chloride 0.9 % bolus 1,000 mL (0 mLs Intravenous Stopped 08/20/18 0758)  acetaminophen (TYLENOL) tablet 1,000 mg (1,000 mg Oral Given 08/20/18 0719)  cefTRIAXone (ROCEPHIN) 2 g in sodium chloride 0.9 % 100 mL IVPB (0 g Intravenous Stopped 08/20/18 0838)  morphine 4 MG/ML injection 4 mg (4 mg Intravenous Given 08/20/18 0803)  ondansetron (ZOFRAN) injection 4 mg (4 mg Intravenous Given 08/20/18 0802)  sodium chloride 0.9 % bolus 1,000 mL (0 mLs Intravenous Stopped  08/20/18 0920)  sodium chloride 0.9 % bolus 1,000 mL (0 mLs Intravenous Stopped 08/20/18 0920)  iohexol (OMNIPAQUE) 300 MG/ML solution 100 mL (100 mLs Intravenous Contrast Given 08/20/18 0955)    Mobility walks Low fall risk   Focused Assessments Cardiac Assessment Handoff:  Cardiac Rhythm: Sinus tachycardia No results found for: CKTOTAL, CKMB, CKMBINDEX, TROPONINI No results found for: DDIMER Does the Patient currently have chest pain? No      R Recommendations: See Admitting Provider Note  Report given to:   Additional Notes:

## 2018-08-20 NOTE — Lactation Note (Signed)
Lactation Consultation Note  Patient Name: Sara Wolf OMBTD'H Date: 08/20/2018 Reason for consult: Initial assessment;Mother's request;Primapara;Late-preterm 34-36.6wks;Breast reduction;Other (Comment)(Mom readmit with s/s sepsis (Rt sided pyelonephritis)  Mom delivered a 66.33 week old girl at [place] 08/15/2018.  Mom presented to [facility] ER today and was admitted with s/s of sepsis and right sided pyelonephritis.  Mom's plan was to breast feed.  She is Gr1.  Mom had a breast reduction in 2001.  Assisted mom with setting up Symphony pump and pumping bilaterally.  Mom needs assistance holding flanges in place and cleaning up equipment.  Mom has a Medela DEBP at home.  She reports she has not expressed but small amounts of colostrum for Sara Wolf so far.  Reviewed supply and demand, breast massage, hand expression, pumping, collection, storage, cleaning, labeling and handling breast milk.  Mom knows the lactation risks after breast reduction.  Lactation name and number written on white board and encouraged to call with any questions, concerns or assistance. Maternal Data Formula Feeding for Exclusion: No Has patient been taught Hand Expression?: Yes Does the patient have breastfeeding experience prior to this delivery?: No(Gr1)  Feeding    LATCH Score                   Interventions Interventions: Breast feeding basics reviewed;Support pillows;Expressed milk;DEBP  Lactation Tools Discussed/Used Tools: Pump Breast pump type: Double-Electric Breast Pump WIC Program: No(UHC Insurance) Pump Review: Setup, frequency, and cleaning;Milk Storage;Other (comment) Initiated by:: S.Licia Harl,RN,BSN,IBCLC Date initiated:: 08/20/18   Consult Status Consult Status: PRN    Jarold Motto 08/20/2018, 8:58 PM

## 2018-08-20 NOTE — Progress Notes (Signed)
CODE SEPSIS - PHARMACY COMMUNICATION  **Broad Spectrum Antibiotics should be administered within 1 hour of Sepsis diagnosis**  Time Code Sepsis Called/Page Received: 4982  Antibiotics Ordered: Ceftriaxone   Time of 1st antibiotic administration: 0808  Additional action taken by pharmacy: non warranted   If necessary, Name of Provider/Nurse Contacted: N/A    Mayjor Ager L , RPh 08/20/2018  8:31 AM

## 2018-08-20 NOTE — H&P (Signed)
[facility] [place] at [facility]   PATIENT NAME: Sara Wolf    MR#:  [id number]  DATE OF BIRTH:  Dec 24, 1985  DATE OF ADMISSION:  08/20/2018  PRIMARY CARE PHYSICIAN: Patient, No Pcp Per   REQUESTING/REFERRING PHYSICIAN: Sharman CheekPhillip Stafford, MD  CHIEF COMPLAINT:   Chief Complaint  Patient presents with  . Tremors  . Fever    HISTORY OF PRESENT ILLNESS:  Sara Wolf  is a 33 y.o. female with a known history of asthma, obesity, recent vaginal delivery 7/14 who presented to the ED with dysuria, urinary frequency, fevers, chills, and inability to empty her bladder.  She states that her symptoms started yesterday.  Her temperature was 100.1 F yesterday and 101 F this morning.  She also endorses right flank pain that started this morning. She had a recent vaginal delivery. She was induced at 35 3/7 weeks for severe preeclampsia.  She has had some elevated blood pressures after delivery and was placed on nifedipine by her OB/GYN.  She is currently breast-feeding.  In the ED, she was meeting sepsis criteria with fever and tachycardia with heart rates up to the 150s.  Labs are significant for hemoglobin 10.7.  UA with large hemoglobin, large leukocytes, few bacteria, >50 WBC.  Chest x-ray was negative.  Right upper quadrant ultrasound was negative.  Renal ultrasound showed some right-sided pyelonephritis.  CT abdomen pelvis showed right kidney edema, consistent with right pyelonephritis.  She was given ceftriaxone and fluids.  Hospitalists were called for admission.  PAST MEDICAL HISTORY:   Past Medical History:  Diagnosis Date  . Asthma   . Complication of anesthesia    'took me a while to wake up'  . Obesity (BMI 30-39.9)     PAST SURGICAL HISTORY:   Past Surgical History:  Procedure Laterality Date  . BREAST REDUCTION SURGERY      SOCIAL HISTORY:   Social History   Tobacco Use  . Smoking status: Former Games developermoker  . Smokeless tobacco: Never Used   Substance Use Topics  . Alcohol use: Not Currently    FAMILY HISTORY:   Family History  Problem Relation Age of Onset  . Healthy Mother   . Hypertension Father   . Asthma Father   . Hyperlipidemia Father   . Stroke Maternal Grandmother   . Diabetes Maternal Grandfather   . Cancer Paternal Grandfather     DRUG ALLERGIES:   Allergies  Allergen Reactions  . Apple   . Peach [Prunus Persica]   . Pear     REVIEW OF SYSTEMS:   Review of Systems  Constitutional: Positive for chills and fever.  HENT: Negative for congestion and sore throat.   Eyes: Negative for blurred vision and double vision.  Respiratory: Negative for cough and shortness of breath.   Cardiovascular: Negative for chest pain and palpitations.  Gastrointestinal: Negative for nausea and vomiting.  Genitourinary: Positive for dysuria, flank pain, frequency and urgency.  Musculoskeletal: Negative for back pain and neck pain.  Neurological: Negative for dizziness and headaches.  Psychiatric/Behavioral: Negative for depression. The patient is not nervous/anxious.     MEDICATIONS AT HOME:   Prior to Admission medications   Medication Sig Start Date End Date Taking? Authorizing Provider  acetaminophen (TYLENOL) 325 MG tablet Take 650 mg by mouth every 6 (six) hours as needed for mild pain or fever.    Yes [provider]  albuterol (VENTOLIN HFA) 108 (90 Base) MCG/ACT inhaler Inhale 2 puffs into the lungs every  6 (six) hours as needed for wheezing or shortness of breath.    Yes [provider]  calcium carbonate (TUMS - DOSED IN MG ELEMENTAL CALCIUM) 500 MG chewable tablet Chew 1 tablet by mouth daily.   Yes [provider]  Fluticasone-Salmeterol (ADVAIR) 250-50 MCG/DOSE AEPB Inhale 1 puff into the lungs 2 (two) times daily.   Yes [provider]  ibuprofen (ADVIL) 600 MG tablet Take 1 tablet (600 mg total) by mouth every 6 (six) hours. 08/17/18  Yes Meisinger, Tawanna Coolerodd, MD   NIFEdipine (ADALAT CC) 60 MG 24 hr tablet Take 1 tablet (60 mg total) by mouth daily. 08/18/18  Yes Meisinger, Todd, MD  Prenatal Vit-Fe Fumarate-FA (PRENATAL MULTIVITAMIN) TABS tablet Take 1 tablet by mouth daily.    Yes [provider]      VITAL SIGNS:  Blood pressure 123/72, pulse (!) 127, temperature 99.3 F (37.4 C), temperature source Oral, resp. rate 19, height 5\' 2"  (1.575 m), weight 111.1 kg, last menstrual period 12/02/2017, SpO2 96 %, unknown if currently breastfeeding.  PHYSICAL EXAMINATION:  Physical Exam  GENERAL:  33 y.o.-year-old patient lying in the bed with no acute distress.  EYES: Pupils equal, round, reactive to light and accommodation. No scleral icterus. Extraocular muscles intact.  HEENT: Head atraumatic, normocephalic. Oropharynx and nasopharynx clear.  NECK:  Supple, no jugular venous distention. No thyroid enlargement, no tenderness.  LUNGS: Normal breath sounds bilaterally, no wheezing, rales,rhonchi or crepitation. No use of accessory muscles of respiration.  CARDIOVASCULAR: Tachycardic, regular rhythm, S1, S2 normal. No murmurs, rubs, or gallops.  ABDOMEN: Soft, nondistended. Bowel sounds present. No organomegaly or mass. +suprapubic tenderness BACK: +right CVA tenderness EXTREMITIES: No pedal edema, cyanosis, or clubbing.  NEUROLOGIC: Cranial nerves II through XII are intact. Muscle strength 5/5 in all extremities. Sensation intact. Gait not checked.  PSYCHIATRIC: The patient is alert and oriented x 3.  SKIN: No obvious rash, lesion, or ulcer.   LABORATORY PANEL:   CBC Recent Labs  Lab 08/20/18 0637  WBC 8.8  HGB 10.7*  HCT 32.7*  PLT 145*   ------------------------------------------------------------------------------------------------------------------  Chemistries  Recent Labs  Lab 08/20/18 0637  NA 140  K 3.6  CL 111  CO2 19*  GLUCOSE 100*  BUN 22*  CREATININE 0.82  CALCIUM 7.9*  AST 48*  ALT 95*  ALKPHOS 99  BILITOT  1.0   ------------------------------------------------------------------------------------------------------------------  Cardiac Enzymes No results for input(s): TROPONINI in the last 168 hours. ------------------------------------------------------------------------------------------------------------------  RADIOLOGY:  Ct Abdomen Pelvis W Contrast  Result Date: 08/20/2018 CLINICAL DATA:  Vaginal delivery on 08/15/2018.  Fever and chills. EXAM: CT ABDOMEN AND PELVIS WITH CONTRAST TECHNIQUE: Multidetector CT imaging of the abdomen and pelvis was performed using the standard protocol following bolus administration of intravenous contrast. CONTRAST:  100mL OMNIPAQUE IOHEXOL 300 MG/ML  SOLN COMPARISON:  Ultrasound same day FINDINGS: Lower chest: Normal Hepatobiliary: Normal Pancreas: Normal Spleen: Normal Adrenals/Urinary Tract: Adrenal glands are normal. Left kidney is normal. Right kidney shows swelling and surrounding edema with areas of relatively poor function most consistent with pyelonephritis. Mild fullness of the renal collecting system, consistent with recent pregnancy. No sign of stone disease. Bladder appears normal. Stomach/Bowel: Normal Vascular/Lymphatic: Normal Reproductive: Uterus is enlarged consistent with recent childbirth. No unexpected finding. No adnexal lesion. Other: No free fluid or air. Musculoskeletal: Normal IMPRESSION: The right kidney is enlarged and shows surrounding edema and areas of relatively poor function, consistent with right pyelonephritis. Mild fullness of the renal collecting system consistent with recent pregnancy  but no sign of obstruction. Electronically Signed   By: Nelson Chimes Wolf.   On: 08/20/2018 10:20   US Renal  Result Date: 08/20/2018 CLINICAL DATA:  Right upper quadrant abdominal pain. EXAM: RENAL / URINARY TRACT ULTRASOUND COMPLETE COMPARISON:  None. FINDINGS: Right Kidney: Renal measurements: 12.9 x 7.4 x 6.7 cm = volume: 340 mL . Echogenicity  within normal limits. No mass visualized. Possible minimal right hydronephrosis is noted. Left Kidney: Renal measurements: 13.9 x 6.7 x 5.2 cm = volume: 256 mL. Echogenicity within normal limits. No mass or hydronephrosis visualized. Bladder: Appears normal for degree of bladder distention. IMPRESSION: Possible minimal right hydronephrosis. Electronically Signed   By: Marijo Conception Wolf.   On: 08/20/2018 09:24   Dg Chest Port 1 View  Result Date: 08/20/2018 CLINICAL DATA:  Initial evaluation for acute postpartum fever. EXAM: PORTABLE CHEST 1 VIEW COMPARISON:  None available. FINDINGS: Cardiac and mediastinal silhouettes are within normal limits. Lungs mildly hypoinflated. No focal infiltrates. No pulmonary edema or pleural effusion. No pneumothorax. No acute osseous finding. IMPRESSION: No radiographic evidence for active cardiopulmonary disease. Electronically Signed   By: Jeannine Boga Wolf.   On: 08/20/2018 07:11   US Abdomen Limited Ruq  Result Date: 08/20/2018 CLINICAL DATA:  Right upper quadrant abdominal pain. EXAM: ULTRASOUND ABDOMEN LIMITED RIGHT UPPER QUADRANT COMPARISON:  None. FINDINGS: Gallbladder: No gallstones or wall thickening visualized. No sonographic Murphy sign noted by sonographer. Common bile duct: Diameter: 4 mm which is within normal limits. Liver: No focal lesion identified. Within normal limits in parenchymal echogenicity. Portal vein is patent on color Doppler imaging with normal direction of blood flow towards the liver. IMPRESSION: No abnormality seen in the right upper quadrant of the abdomen. Electronically Signed   By: Marijo Conception Wolf.   On: 08/20/2018 09:26      IMPRESSION AND PLAN:   Sepsis secondary to right-sided pyelonephritis- meeting sepsis criteria on admission with fevers and tachycardia. -Continue ceftriaxone -Continue IV fluids -Follow-up blood and urine cultures  Sinus tachycardia-likely due to above -IV fluids -Check TSH  Recent vaginal  delivery 7/14- patient is currently breast-feeding -Will order breast pump so patient can pump during hospitalization -Avoid meds that are contraindicated in breastfeeding -Continue prenatal vitamin  Severe preeclampsia- BPs have remained elevated after delivery -Continue home nifedipine -Labetalol IV prn  Chronic asthma- no signs of acute exacerbation -Continue home inhalers  All the records are reviewed and case discussed with ED provider. Management plans discussed with the patient, family and they are in agreement.  CODE STATUS: Full  TOTAL TIME TAKING CARE OF THIS PATIENT: 45 minutes.    Berna Spare Skilar Marcou Wolf on 08/20/2018 at 11:00 AM  Between 7am to 6pm - Pager - [phone]  After 6pm go to [email or web address] - Proofreader  [facility] [place] [facility]  Office  [phone]  CC: Primary care physician; Patient, No Pcp Per   Note: This dictation was prepared with Dragon dictation along with smaller phrase technology. Any transcriptional errors that result from this process are unintentional.

## 2018-08-20 NOTE — ED Triage Notes (Signed)
Patient arrives from home with fever and chills s/p vaginal delivery of baby girl 7/14. This is patient's first baby, no complications with delivery, dx with preeclampsia and was induced. Saturday night began experiencing tremors and chills, called OBGYN who told her it could be "her breast milk coming in, she should continue to pump and see if the fever goes down". Patient initially 180 ST with EMS. Patient had some stitches placed for vaginal tearing

## 2018-08-20 NOTE — ED Notes (Signed)
Patient transported to CT 

## 2018-08-21 LAB — BASIC METABOLIC PANEL
Anion gap: 7 (ref 5–15)
BUN: 17 mg/dL (ref 6–20)
CO2: 22 mmol/L (ref 22–32)
Calcium: 7.7 mg/dL — ABNORMAL LOW (ref 8.9–10.3)
Chloride: 111 mmol/L (ref 98–111)
Creatinine, Ser: 0.6 mg/dL (ref 0.44–1.00)
GFR calc Af Amer: >60 mL/min (ref >60)
GFR calc non Af Amer: >60 mL/min (ref >60)
Glucose, Bld: 93 mg/dL (ref 70–99)
Potassium: 3.6 mmol/L (ref 3.5–5.1)
Sodium: 140 mmol/L (ref 135–145)

## 2018-08-21 LAB — CBC
HCT: 31 % — ABNORMAL LOW (ref 36.0–46.0)
Hemoglobin: 9.9 g/dL — ABNORMAL LOW (ref 12.0–15.0)
MCH: 28.3 pg (ref 26.0–34.0)
MCHC: 31.9 g/dL (ref 30.0–36.0)
MCV: 88.6 fL (ref 80.0–100.0)
Platelets: 131 10*3/uL — ABNORMAL LOW (ref 150–400)
RBC: 3.5 MIL/uL — ABNORMAL LOW (ref 3.87–5.11)
RDW: 14.1 % (ref 11.5–15.5)
WBC: 8.9 10*3/uL (ref 4.0–10.5)
nRBC: 0 % (ref 0.0–0.2)

## 2018-08-21 NOTE — Progress Notes (Signed)
[facility] - [place] at [facility]   PATIENT NAME: Sara BooksDesiree Wolf    MR#:  [id number]  DATE OF BIRTH:  [birth year]  SUBJECTIVE:   Patient feels a whole lot better. She is ambulatory able to eat no nausea vomiting. Denies any back pain. REVIEW OF SYSTEMS:   Review of Systems  Constitutional: Negative for chills, fever and weight loss.  HENT: Negative for ear discharge, ear pain and nosebleeds.   Eyes: Negative for blurred vision, pain and discharge.  Respiratory: Negative for sputum production, shortness of breath, wheezing and stridor.   Cardiovascular: Negative for chest pain, palpitations, orthopnea and PND.  Gastrointestinal: Negative for abdominal pain, diarrhea, nausea and vomiting.  Genitourinary: Negative for frequency and urgency.  Musculoskeletal: Negative for back pain and joint pain.  Neurological: Negative for sensory change, speech change, focal weakness and weakness.  Psychiatric/Behavioral: Negative for depression and hallucinations. The patient is not nervous/anxious.    Tolerating Diet:yes Tolerating PT: not needed  DRUG ALLERGIES:   Allergies  Allergen Reactions  . Apple   . Peach [Prunus Persica]   . Pear     VITALS:  Blood pressure 128/83, pulse 90, temperature 98 F (36.7 C), resp. rate 18, height 5\' 2"  (1.575 m), weight 111.1 kg, last menstrual period 12/02/2017, SpO2 100 %, unknown if currently breastfeeding.  PHYSICAL EXAMINATION:   Physical Exam  GENERAL:  33 y.o.-year-old patient lying in the bed with no acute distress. obese EYES: Pupils equal, round, reactive to light and accommodation. No scleral icterus. Extraocular muscles intact.  HEENT: Head atraumatic, normocephalic. Oropharynx and nasopharynx clear.  NECK:  Supple, no jugular venous distention. No thyroid enlargement, no tenderness.  LUNGS: Normal breath sounds bilaterally, no wheezing, rales, rhonchi. No use of accessory muscles of respiration.   CARDIOVASCULAR: S1, S2 normal. No murmurs, rubs, or gallops.  ABDOMEN: Soft, nontender, nondistended. Bowel sounds present. No organomegaly or mass.  EXTREMITIES: No cyanosis, clubbing or edema b/l.    NEUROLOGIC: Cranial nerves II through XII are intact. No focal Motor or sensory deficits b/l.   PSYCHIATRIC:  patient is alert and oriented x 3.  SKIN: No obvious rash, lesion, or ulcer.   LABORATORY PANEL:  CBC Recent Labs  Lab 08/21/18 0406  WBC 8.9  HGB 9.9*  HCT 31.0*  PLT 131*    Chemistries  Recent Labs  Lab 08/20/18 0637 08/21/18 0406  NA 140 140  K 3.6 3.6  CL 111 111  CO2 19* 22  GLUCOSE 100* 93  BUN 22* 17  CREATININE 0.82 0.60  CALCIUM 7.9* 7.7*  AST 48*  --   ALT 95*  --   ALKPHOS 99  --   BILITOT 1.0  --    Cardiac Enzymes No results for input(s): TROPONINI in the last 168 hours. RADIOLOGY:  Ct Abdomen Pelvis W Contrast  Result Date: 08/20/2018 CLINICAL DATA:  Vaginal delivery on 08/15/2018.  Fever and chills. EXAM: CT ABDOMEN AND PELVIS WITH CONTRAST TECHNIQUE: Multidetector CT imaging of the abdomen and pelvis was performed using the standard protocol following bolus administration of intravenous contrast. CONTRAST:  100mL OMNIPAQUE IOHEXOL 300 MG/ML  SOLN COMPARISON:  Ultrasound same day FINDINGS: Lower chest: Normal Hepatobiliary: Normal Pancreas: Normal Spleen: Normal Adrenals/Urinary Tract: Adrenal glands are normal. Left kidney is normal. Right kidney shows swelling and surrounding edema with areas of relatively poor function most consistent with pyelonephritis. Mild fullness of the renal collecting system, consistent with recent pregnancy. No sign of stone disease. Bladder appears normal.  Stomach/Bowel: Normal Vascular/Lymphatic: Normal Reproductive: Uterus is enlarged consistent with recent childbirth. No unexpected finding. No adnexal lesion. Other: No free fluid or air. Musculoskeletal: Normal IMPRESSION: The right kidney is enlarged and shows  surrounding edema and areas of relatively poor function, consistent with right pyelonephritis. Mild fullness of the renal collecting system consistent with recent pregnancy but no sign of obstruction. Electronically Signed   By: Paulina FusiMark  Shogry M.D.   On: 08/20/2018 10:20   [place] Renal  Result Date: 08/20/2018 CLINICAL DATA:  Right upper quadrant abdominal pain. EXAM: RENAL / URINARY TRACT ULTRASOUND COMPLETE COMPARISON:  None. FINDINGS: Right Kidney: Renal measurements: 12.9 x 7.4 x 6.7 cm = volume: 340 mL . Echogenicity within normal limits. No mass visualized. Possible minimal right hydronephrosis is noted. Left Kidney: Renal measurements: 13.9 x 6.7 x 5.2 cm = volume: 256 mL. Echogenicity within normal limits. No mass or hydronephrosis visualized. Bladder: Appears normal for degree of bladder distention. IMPRESSION: Possible minimal right hydronephrosis. Electronically Signed   By: Lupita RaiderJames  Green Jr M.D.   On: 08/20/2018 09:24   Dg Chest Port 1 View  Result Date: 08/20/2018 CLINICAL DATA:  Initial evaluation for acute postpartum fever. EXAM: PORTABLE CHEST 1 VIEW COMPARISON:  None available. FINDINGS: Cardiac and mediastinal silhouettes are within normal limits. Lungs mildly hypoinflated. No focal infiltrates. No pulmonary edema or pleural effusion. No pneumothorax. No acute osseous finding. IMPRESSION: No radiographic evidence for active cardiopulmonary disease. Electronically Signed   By: Rise MuBenjamin  McClintock M.D.   On: 08/20/2018 07:11   [place] Abdomen Limited Ruq  Result Date: 08/20/2018 CLINICAL DATA:  Right upper quadrant abdominal pain. EXAM: ULTRASOUND ABDOMEN LIMITED RIGHT UPPER QUADRANT COMPARISON:  None. FINDINGS: Gallbladder: No gallstones or wall thickening visualized. No sonographic Murphy sign noted by sonographer. Common bile duct: Diameter: 4 mm which is within normal limits. Liver: No focal lesion identified. Within normal limits in parenchymal echogenicity. Portal vein is patent on color  Doppler imaging with normal direction of blood flow towards the liver. IMPRESSION: No abnormality seen in the right upper quadrant of the abdomen. Electronically Signed   By: Lupita RaiderJames  Green Jr M.D.   On: 08/20/2018 09:26   ASSESSMENT AND PLAN:   Sara BooksDesiree Tetzlaff  is a 33 y.o. female with a known history of asthma, obesity, recent vaginal delivery 7/14 who presented to the ED with dysuria, urinary frequency, fevers, chills, and inability to empty her bladder.  She states that her symptoms started yesterday.  Her temperature was 100.1 F yesterday and 101 F this morning.  She also endorses right flank pain that started this morning  # E coli Sepsis secondary to right-sided pyelonephritis- meeting sepsis criteria on admission with fevers and tachycardia. -Continue Meropenem -received IV fluids-- will discontinue fluids. Patient tolerating PO as well -blood culture positive for E. Coli.  #Sinus tachycardia-likely due to above -asymptomatic  # Recent vaginal delivery 7/14- patient is currently breast-feeding -Will order breast pump so patient can pump during hospitalization -Avoid meds that are contraindicated in breastfeeding -Continue prenatal vitamin  #Severe preeclampsia- BPs have remained elevated after delivery -Continue home nifedipine -Labetalol IV prn  #Chronic asthma- no signs of acute exacerbation -Continue home inhalers   Case discussed with Care Management/Social Worker. Management plans discussed with the patient  CODE STATUS: full  DVT Prophylaxis: lovenox  TOTAL TIME TAKING CARE OF THIS PATIENT: *30* minutes.  >50% time spent on counselling and coordination of care  POSSIBLE D/C IN *1-2* DAYS, DEPENDING ON CLINICAL CONDITION.  Note: This  dictation was prepared with Dragon dictation along with smaller phrase technology. Any transcriptional errors that result from this process are unintentional.  Fritzi Mandes M.D on 08/21/2018 at 3:08 PM  Between 7am to 6pm - Pager -  [phone]  After 6pm go to [email or web address] - password [email or web address] [place] [facility]  Office  [phone]  CC: Primary care physician; Patient, No Pcp PerPatient ID: Osvaldo Shipper, female   DOB: [birth year], 33 y.o.   MRN: [id number]

## 2018-08-21 NOTE — Lactation Note (Signed)
Lactation Consultation Note  Patient Name: Sara Wolf [id number] Date: 08/21/2018 Reason for consult: Follow-up assessment  LC assisted patient with use of DEBP. Patient reported limited pumping over night hours to 1 time due to be tired, and inability to hold right side of pump due to IV in arm/alarm going off with movement. LC assembled the pump parts and pieces, assisted with placement of flanges onto breasts, and held parts as the DEBP was used for 15 minutes. LC adjusted the suction level as appropriate for comfort of patient. While pumping LC discussed with patient the importance of consistency with pumping every 2-3hours to help with stimulation and onset of transition from colostrum to mature milk. At end of the 15 minute pumping session, patient had expressed 84ml from her left breast. Pump disconnected and cleaned, and left to air dry by LC. [facility] name, and contact information was left on whiteboard for patient if she has any questions or concerns.   Maternal Data    Feeding    LATCH Score                   Interventions Interventions: Breast feeding basics reviewed;Support pillows;Expressed milk;DEBP  Lactation Tools Discussed/Used Tools: Pump Breast pump type: Double-Electric Breast Pump   Consult Status Consult Status: Follow-up Date: 08/21/18 Follow-up type: In-patient    Sara Wolf 08/21/2018, 10:57 AM

## 2018-08-22 LAB — URINE CULTURE: Culture: >=100000 — AB

## 2018-08-22 LAB — HIV ANTIBODY (ROUTINE TESTING W REFLEX): HIV Screen 4th Generation wRfx: NONREACTIVE

## 2018-08-22 MED ORDER — CEPHALEXIN 500 MG PO CAPS: 500.0000 mg | ORAL_CAPSULE | Freq: Four times a day (QID) | ORAL | 0 refills | Status: AC

## 2018-08-22 MED ORDER — CEPHALEXIN 500 MG PO CAPS: 500.0000 mg | ORAL_CAPSULE | Freq: Four times a day (QID) | ORAL | Status: DC

## 2018-08-22 MED ORDER — SODIUM CHLORIDE 0.9 % IV SOLN
2.0000 g | INTRAVENOUS | Status: DC
  Administered 2018-08-22: 11:00:00 2 g via INTRAVENOUS
  Filled 2018-08-22: qty 2

## 2018-08-22 NOTE — Discharge Instructions (Addendum)
Patient advised to drink plenty of fluids patient will follow-up with her OB/GYN for her routine schedule appointments

## 2018-08-22 NOTE — Discharge Summary (Signed)
[facility] [place] at [facility]   PATIENT NAME: Sara Wolf    MR#:  [id number]  DATE OF BIRTH:  05/16/1985  DATE OF ADMISSION:  08/20/2018 ADMITTING PHYSICIAN: Campbell Stall, MD  DATE OF DISCHARGE: 08/22/2018  PRIMARY CARE PHYSICIAN: Patient, No Pcp Per    ADMISSION DIAGNOSIS:  Cystitis [N30.90] Pyelonephritis of right kidney [N12] Sepsis, due to unspecified organism, unspecified whether acute organ dysfunction present (HCC) [A41.9]  DISCHARGE DIAGNOSIS:  E. coli sepsis acute right sided pyelonephritis  SECONDARY DIAGNOSIS:   Past Medical History:  Diagnosis Date  . Asthma   . Complication of anesthesia    'took me a while to wake up'  . Obesity (BMI 30-39.9)     HOSPITAL COURSE:   [name] a32 y.o.femalewith a known history of asthma, obesity, recent vaginal delivery 7/14 who presented to the ED with dysuria, urinary frequency, fevers, chills, and inability to empty her bladder. She states that her symptoms started yesterday. Her temperature was 100.1F yesterday and 101 F this morning. She also endorses right flank pain that started this morning  # E coli Sepsis secondary to right-sided pyelonephritis-meeting sepsis criteria on admission with fevers and tachycardia. -Continue Meropenem--change to IV rocephin today--out pt po keflex qid (total 10 days) -received IV fluids-- will discontinue fluids. Patient tolerating PO as well -blood culture positive for E. Coli.  #Sinus tachycardia-likely due to above -asymptomatic  # Recent vaginal delivery7/14-patient is currently breast-feeding -has breast pump so patient can pumpduring hospitalization -Avoid meds that are contraindicated in breastfeeding -Continue prenatal vitamin  #Severe preeclampsia-BPshave remained elevated after delivery -Continue home nifedipine -Labetalol IV prn  #Chronic asthma-no signs of acute exacerbation -Continue home  inhalers  overall feels better. Patient eating well. No fever. Gave option for patient to stay another day for IV antibiotic however she wants to go home and be with her baby. I think this is reasonable and she will finish her oral antibiotic as outpatient and follow-up with OB/GYN as outpatient. Advised to drink plenty of fluids CONSULTS OBTAINED:    DRUG ALLERGIES:   Allergies  Allergen Reactions  . Apple   . Peach [Prunus Persica]   . Pear     DISCHARGE MEDICATIONS:   Allergies as of 08/22/2018      Reactions   Apple    Peach [prunus Persica]    Pear       Medication List    TAKE these medications   acetaminophen 325 MG tablet Commonly known as: TYLENOL Take 650 mg by mouth every 6 (six) hours as needed for mild pain or fever.   albuterol 108 (90 Base) MCG/ACT inhaler Commonly known as: VENTOLIN HFA Inhale 2 puffs into the lungs every 6 (six) hours as needed for wheezing or shortness of breath.   calcium carbonate 500 MG chewable tablet Commonly known as: TUMS - dosed in mg elemental calcium Chew 1 tablet by mouth daily.   cephALEXin 500 MG capsule Commonly known as: KEFLEX Take 1 capsule (500 mg total) by mouth 4 (four) times daily for 8 days. Start taking on: August 23, 2018   Fluticasone-Salmeterol 250-50 MCG/DOSE Aepb Commonly known as: ADVAIR Inhale 1 puff into the lungs 2 (two) times daily.   ibuprofen 600 MG tablet Commonly known as: ADVIL Take 1 tablet (600 mg total) by mouth every 6 (six) hours.   NIFEdipine 60 MG 24 hr tablet Commonly known as: ADALAT CC Take 1 tablet (60 mg total) by mouth daily.   prenatal  multivitamin Tabs tablet Take 1 tablet by mouth daily.       If you experience worsening of your admission symptoms, develop shortness of breath, life threatening emergency, suicidal or homicidal thoughts you must seek medical attention immediately by calling 911 or calling your MD immediately  if symptoms less severe.  You Must read  complete instructions/literature along with all the possible adverse reactions/side effects for all the Medicines you take and that have been prescribed to you. Take any new Medicines after you have completely understood and accept all the possible adverse reactions/side effects.   Please note  You were cared for by a hospitalist during your hospital stay. If you have any questions about your discharge medications or the care you received while you were in the hospital after you are discharged, you can call the unit and asked to speak with the hospitalist on call if the hospitalist that took care of you is not available. Once you are discharged, your primary care physician will handle any further medical issues. Please note that NO REFILLS for any discharge medications will be authorized once you are discharged, as it is imperative that you return to your primary care physician (or establish a relationship with a primary care physician if you do not have one) for your aftercare needs so that they can reassess your need for medications and monitor your lab values. Today   SUBJECTIVE   Doing well. No fever. Good PO intake.  VITAL SIGNS:  Blood pressure (!) 141/87, pulse (!) 111, temperature 98.5 F (36.9 C), temperature source Oral, resp. rate 16, height  (1.575 m), weight 111.1 kg, last menstrual period 12/02/2017, SpO2 100 %, unknown if currently breastfeeding.  I/O:    Intake/Output Summary (Last 24 hours) at 08/22/2018 0951 Last data filed at 08/22/2018 1610 Gross per 24 hour  Intake 2915.67 ml  Output 2600 ml  Net 315.67 ml    PHYSICAL EXAMINATION:  GENERAL:  33 y.o.-year-old patient lying in the bed with no acute distress.  EYES: Pupils equal, round, reactive to light and accommodation. No scleral icterus. Extraocular muscles intact.  HEENT: Head atraumatic, normocephalic. Oropharynx and nasopharynx clear.  NECK:  Supple, no jugular venous distention. No thyroid enlargement, no  tenderness.  LUNGS: Normal breath sounds bilaterally, no wheezing, rales,rhonchi or crepitation. No use of accessory muscles of respiration.  CARDIOVASCULAR: S1, S2 normal. No murmurs, rubs, or gallops.  ABDOMEN: Soft, non-tender, non-distended. Bowel sounds present. No organomegaly or mass.  EXTREMITIES: No pedal edema, cyanosis, or clubbing.  NEUROLOGIC: Cranial nerves II through XII are intact. Muscle strength 5/5 in all extremities. Sensation intact. Gait not checked.  PSYCHIATRIC: The patient is alert and oriented x 3.  SKIN: No obvious rash, lesion, or ulcer.   DATA REVIEW:   CBC  Recent Labs  Lab 08/21/18 0406  WBC 8.9  HGB 9.9*  HCT 31.0*  PLT 131*    Chemistries  Recent Labs  Lab 08/20/18 0637 08/21/18 0406  NA 140 140  K 3.6 3.6  CL 111 111  CO2 19* 22  GLUCOSE 100* 93  BUN 22* 17  CREATININE 0.82 0.60  CALCIUM 7.9* 7.7*  AST 48*  --   ALT 95*  --   ALKPHOS 99  --   BILITOT 1.0  --     Microbiology Results   Recent Results (from the past 240 hour(s))  Group B strep by PCR     Status: None   Collection Time: 08/14/18  5:05 PM  Specimen: Vaginal/Rectal; Genital  Result Value Ref Range Status   Group B strep by PCR NEGATIVE NEGATIVE Final    Comment: (NOTE) Intrapartum testing with Xpert GBS assay should be used as an adjunct to other methods available and not used to replace antepartum testing (at 35-[redacted] weeks gestation). Performed at [facility], [address] [place], [place] [address]   SARS Coronavirus 2 (CEPHEID - Performed in [facility] hospital lab), Hosp Order     Status: None   Collection Time: 08/14/18  5:24 PM   Specimen: Nasopharyngeal Swab  Result Value Ref Range Status   SARS Coronavirus 2 NEGATIVE NEGATIVE Final    Comment: (NOTE) If result is NEGATIVE SARS-CoV-2 target nucleic acids are NOT DETECTED. The SARS-CoV-2 RNA is generally detectable in upper and lower  respiratory specimens during the acute phase of infection.  The lowest  concentration of SARS-CoV-2 viral copies this assay can detect is 250  copies / mL. A negative result does not preclude SARS-CoV-2 infection  and should not be used as the sole basis for treatment or other  patient management decisions.  A negative result may occur with  improper specimen collection / handling, submission of specimen other  than nasopharyngeal swab, presence of viral mutation(s) within the  areas targeted by this assay, and inadequate number of viral copies  (<250 copies / mL). A negative result must be combined with clinical  observations, patient history, and epidemiological information. If result is POSITIVE SARS-CoV-2 target nucleic acids are DETECTED. The SARS-CoV-2 RNA is generally detectable in upper and lower  respiratory specimens dur ing the acute phase of infection.  Positive  results are indicative of active infection with SARS-CoV-2.  Clinical  correlation with patient history and other diagnostic information is  necessary to determine patient infection status.  Positive results do  not rule out bacterial infection or co-infection with other viruses. If result is PRESUMPTIVE POSTIVE SARS-CoV-2 nucleic acids MAY BE PRESENT.   A presumptive positive result was obtained on the submitted specimen  and confirmed on repeat testing.  While 2019 novel coronavirus  (SARS-CoV-2) nucleic acids may be present in the submitted sample  additional confirmatory testing may be necessary for epidemiological  and / or clinical management purposes  to differentiate between  SARS-CoV-2 and other Sarbecovirus currently known to infect humans.  If clinically indicated additional testing with an alternate test  methodology [phone]([id number]) is advised. The SARS-CoV-2 RNA is generally  detectable in upper and lower respiratory sp ecimens during the acute  phase of infection. The expected result is Negative. Fact Sheet for Patients:   BoilerBrush.com.cyhttps://www.fda.gov/media/136312/download Fact Sheet for Healthcare Providers: https://pope.com/https://www.fda.gov/media/136313/download This test is not yet approved or cleared by the [place] FDA and has been authorized for detection and/or diagnosis of SARS-CoV-2 by FDA under an Emergency Use Authorization (EUA).  This EUA will remain in effect (meaning this test can be used) for the duration of the COVID-19 declaration under Section 564(b)(1) of the Act, 21 U.S.C. section 360bbb-3(b)(1), unless the authorization is terminated or revoked sooner. Performed at [facility], [address] [place], [place] [address]   Blood Culture (routine x 2)     Status: Abnormal (Preliminary result)   Collection Time: 08/20/18  6:37 AM   Specimen: BLOOD  Result Value Ref Range Status   Specimen Description   Final    BLOOD LEFT HAND Performed at [facility], [address] [place], [place] [address]    Special Requests  Final    BOTTLES DRAWN AEROBIC AND ANAEROBIC Blood Culture results may not be optimal due to an excessive volume of blood received in culture bottles Performed at [facility], [address] [place], [place] [address]    Culture  Setup Time   Final    GRAM NEGATIVE RODS IN BOTH AEROBIC AND ANAEROBIC BOTTLES CRITICAL RESULT CALLED TO, READ BACK BY AND VERIFIED WITH: CHARLES SHANLEVER 08/20/2018 AT 1622 BY HS    Culture (A)  Final    ESCHERICHIA COLI SUSCEPTIBILITIES TO FOLLOW Performed at [facility], [address] [place], [place] [address]    Report Status PENDING  Incomplete  Blood Culture (routine x 2)     Status: Abnormal (Preliminary result)   Collection Time: 08/20/18  6:37 AM   Specimen: BLOOD  Result Value Ref Range Status   Specimen Description   Final    BLOOD LEFT ANTECUBITAL Performed at [facility], [address] [place], [place] [address]    Special Requests   Final    BOTTLES DRAWN AEROBIC AND ANAEROBIC Blood  Culture adequate volume Performed at [facility], [address] [place], [place] [address]    Culture  Setup Time   Final    GRAM NEGATIVE RODS IN BOTH AEROBIC AND ANAEROBIC BOTTLES CRITICAL VALUE NOTED.  VALUE IS CONSISTENT WITH PREVIOUSLY REPORTED AND CALLED VALUE.    Culture ESCHERICHIA COLI (A)  Final   Report Status PENDING  Incomplete  Blood Culture ID Panel (Reflexed)     Status: Abnormal   Collection Time: 08/20/18  6:37 AM  Result Value Ref Range Status   Enterococcus species NOT DETECTED NOT DETECTED Final   Listeria monocytogenes NOT DETECTED NOT DETECTED Final   Staphylococcus species NOT DETECTED NOT DETECTED Final   Staphylococcus aureus (BCID) NOT DETECTED NOT DETECTED Final   Streptococcus species NOT DETECTED NOT DETECTED Final   Streptococcus agalactiae NOT DETECTED NOT DETECTED Final   Streptococcus pneumoniae NOT DETECTED NOT DETECTED Final   Streptococcus pyogenes NOT DETECTED NOT DETECTED Final   Acinetobacter baumannii NOT DETECTED NOT DETECTED Final   Enterobacteriaceae species DETECTED (A) NOT DETECTED Final    Comment: Enterobacteriaceae represent a large family of gram-negative bacteria, not a single organism. CRITICAL RESULT CALLED TO, READ BACK BY AND VERIFIED WITH: CHARLES SHANLEVER 08/20/2018 AT 0422 BY H SOEWARDIMAN CALLED 08/20/2018 AT 1622 HS    Enterobacter cloacae complex NOT DETECTED NOT DETECTED Final   Escherichia coli DETECTED (A) NOT DETECTED Final    Comment: CRITICAL RESULT CALLED TO, READ BACK BY AND VERIFIED WITH: CHARLES SHANLEVER 08/20/2018 AT 0422 BY H SOEWARDIMAN CALLED 08/20/2018 AT 1622 HS    Klebsiella oxytoca NOT DETECTED NOT DETECTED Final   Klebsiella pneumoniae NOT DETECTED NOT DETECTED Final   Proteus species NOT DETECTED NOT DETECTED Final   Serratia marcescens NOT DETECTED NOT DETECTED Final   Carbapenem resistance NOT DETECTED NOT DETECTED Final   Haemophilus influenzae NOT DETECTED NOT DETECTED Final    Neisseria meningitidis NOT DETECTED NOT DETECTED Final   Pseudomonas aeruginosa NOT DETECTED NOT DETECTED Final   Candida albicans NOT DETECTED NOT DETECTED Final   Candida glabrata NOT DETECTED NOT DETECTED Final   Candida krusei NOT DETECTED NOT DETECTED Final   Candida parapsilosis NOT DETECTED NOT DETECTED Final   Candida tropicalis NOT DETECTED NOT DETECTED Final    Comment: Performed at [facility], [address] [place], [place] [address]  Urine culture  Status: Abnormal   Collection Time: 08/20/18  7:04 AM   Specimen: In/Out Cath Urine  Result Value Ref Range Status   Specimen Description   Final    IN/OUT CATH URINE Performed at [facility], [address] [place], [place] [address]    Special Requests   Final    NONE Performed at [facility], [address] [place], [place] [address]    Culture >=100,000 COLONIES/mL ESCHERICHIA COLI (A)  Final   Report Status 08/22/2018 FINAL  Final   Organism ID, Bacteria ESCHERICHIA COLI (A)  Final      Susceptibility   Escherichia coli - MIC*    AMPICILLIN 8 SENSITIVE Sensitive     CEFAZOLIN <=4 SENSITIVE Sensitive     CEFTRIAXONE <=1 SENSITIVE Sensitive     CIPROFLOXACIN <=0.25 SENSITIVE Sensitive     GENTAMICIN <=1 SENSITIVE Sensitive     IMIPENEM <=0.25 SENSITIVE Sensitive     NITROFURANTOIN <=16 SENSITIVE Sensitive     TRIMETH/SULFA <=20 SENSITIVE Sensitive     AMPICILLIN/SULBACTAM 4 SENSITIVE Sensitive     PIP/TAZO <=4 SENSITIVE Sensitive     Extended ESBL NEGATIVE Sensitive     * >=100,000 COLONIES/mL ESCHERICHIA COLI  SARS Coronavirus 2 (CEPHEID - Performed in [facility] hospital lab), Hosp Order     Status: None   Collection Time: 08/20/18  7:04 AM   Specimen: Nasopharyngeal Swab  Result Value Ref Range Status   SARS Coronavirus 2 NEGATIVE NEGATIVE Final    Comment: (NOTE) If result is NEGATIVE SARS-CoV-2 target nucleic acids are NOT DETECTED. The SARS-CoV-2 RNA is generally  detectable in upper and lower  respiratory specimens during the acute phase of infection. The lowest  concentration of SARS-CoV-2 viral copies this assay can detect is 250  copies / mL. A negative result does not preclude SARS-CoV-2 infection  and should not be used as the sole basis for treatment or other  patient management decisions.  A negative result may occur with  improper specimen collection / handling, submission of specimen other  than nasopharyngeal swab, presence of viral mutation(s) within the  areas targeted by this assay, and inadequate number of viral copies  (<250 copies / mL). A negative result must be combined with clinical  observations, patient history, and epidemiological information. If result is POSITIVE SARS-CoV-2 target nucleic acids are DETECTED. The SARS-CoV-2 RNA is generally detectable in upper and lower  respiratory specimens dur ing the acute phase of infection.  Positive  results are indicative of active infection with SARS-CoV-2.  Clinical  correlation with patient history and other diagnostic information is  necessary to determine patient infection status.  Positive results do  not rule out bacterial infection or co-infection with other viruses. If result is PRESUMPTIVE POSTIVE SARS-CoV-2 nucleic acids MAY BE PRESENT.   A presumptive positive result was obtained on the submitted specimen  and confirmed on repeat testing.  While 2019 novel coronavirus  (SARS-CoV-2) nucleic acids may be present in the submitted sample  additional confirmatory testing may be necessary for epidemiological  and / or clinical management purposes  to differentiate between  SARS-CoV-2 and other Sarbecovirus currently known to infect humans.  If clinically indicated additional testing with an alternate test  methodology [phone]([id number]) is advised. The SARS-CoV-2 RNA is generally  detectable in upper and lower respiratory sp ecimens during the acute  phase of infection. The  expected result is Negative. Fact Sheet for Patients:  BoilerBrush.com.cyhttps://www.fda.gov/media/136312/download Fact Sheet for Healthcare Providers: https://pope.com/https://www.fda.gov/media/136313/download This test  is not yet approved or cleared by the [place] and has been authorized for detection and/or diagnosis of SARS-CoV-2 by FDA under an Emergency Use Authorization (EUA).  This EUA will remain in effect (meaning this test can be used) for the duration of the COVID-19 declaration under Section 564(b)(1) of the Act, 21 U.S.C. section 360bbb-3(b)(1), unless the authorization is terminated or revoked sooner. Performed at [facility], [address] [place], [place] [address]     RADIOLOGY:  Ct Abdomen Pelvis W Contrast  Result Date: 08/20/2018 CLINICAL DATA:  Vaginal delivery on 08/15/2018.  Fever and chills. EXAM: CT ABDOMEN AND PELVIS WITH CONTRAST TECHNIQUE: Multidetector CT imaging of the abdomen and pelvis was performed using the standard protocol following bolus administration of intravenous contrast. CONTRAST:  158mL OMNIPAQUE IOHEXOL 300 MG/ML  SOLN COMPARISON:  Ultrasound same day FINDINGS: Lower chest: Normal Hepatobiliary: Normal Pancreas: Normal Spleen: Normal Adrenals/Urinary Tract: Adrenal glands are normal. Left kidney is normal. Right kidney shows swelling and surrounding edema with areas of relatively poor function most consistent with pyelonephritis. Mild fullness of the renal collecting system, consistent with recent pregnancy. No sign of stone disease. Bladder appears normal. Stomach/Bowel: Normal Vascular/Lymphatic: Normal Reproductive: Uterus is enlarged consistent with recent childbirth. No unexpected finding. No adnexal lesion. Other: No free fluid or air. Musculoskeletal: Normal IMPRESSION: The right kidney is enlarged and shows surrounding edema and areas of relatively poor function, consistent with right pyelonephritis. Mild fullness of the renal collecting system consistent  with recent pregnancy but no sign of obstruction. Electronically Signed   By: Nelson Chimes M.D.   On: 08/20/2018 10:20     CODE STATUS:     Code Status Orders  (From admission, onward)         Start     Ordered   08/20/18 1249  Full code  Continuous     08/20/18 1248        Code Status History    Date Active Date Inactive Code Status Order ID Comments User Context   08/15/2018 1332 08/17/2018 2117 Full Code [id number]  [name], MD Inpatient   08/14/2018 1737 08/15/2018 1332 Full Code [id number]  Rasch, Artist Pais, NP Inpatient   Advance Care Planning Activity      TOTAL TIME TAKING CARE OF THIS PATIENT: *40* minutes.    Fritzi Mandes M.D on 08/22/2018 at 9:51 AM  Between 7am to 6pm - Pager - [phone] After 6pm go to [email or web address] - password [email or web address] [place] [facility]  Office  [phone]  CC: Primary care physician; Patient, No Pcp Per

## 2018-08-22 NOTE — Progress Notes (Signed)
Patient discharged home. Discharge instructions, prescriptions and follow up appointment given to and reviewed with patient. Patient verbalized understanding. Patient wheeled out by RN 

## 2018-08-23 LAB — CULTURE, BLOOD (ROUTINE X 2): Special Requests: ADEQUATE

## 2019-02-02 NOTE — L&D Delivery Note (Signed)
Delivery Note Pt reached complete dilation and pushed well about 15 minutes.  At 10:48 AM a healthy female was delivered via Vaginal, Spontaneous (Presentation: Right Occiput Anterior).  APGAR: 9, 9; weight pending .   Placenta status: Spontaneous, Intact.  Cord: 2 vessels with the following complications: None.    Anesthesia: Epidural Episiotomy: None Lacerations: 2nd degree;Perineal Suture Repair: 3.0 vicryl rapide Est. Blood Loss (mL):  Mom to postpartum.  Baby to Couplet care / Skin to Skin. Parents decline circumcision. Will continue labetalol.  Oliver Pila 11/17/2019, 11:24 AM

## 2019-05-16 LAB — OB RESULTS CONSOLE GC/CHLAMYDIA
Chlamydia: NEGATIVE
Gonorrhea: NEGATIVE

## 2019-05-16 LAB — OB RESULTS CONSOLE RUBELLA ANTIBODY, IGM: Rubella: IMMUNE

## 2019-05-16 LAB — OB RESULTS CONSOLE HIV ANTIBODY (ROUTINE TESTING): HIV: NONREACTIVE

## 2019-05-16 LAB — OB RESULTS CONSOLE HEPATITIS B SURFACE ANTIGEN: Hepatitis B Surface Ag: NEGATIVE

## 2019-05-16 LAB — OB RESULTS CONSOLE ABO/RH: RH Type: POSITIVE

## 2019-05-16 LAB — OB RESULTS CONSOLE ANTIBODY SCREEN: Antibody Screen: NEGATIVE

## 2019-05-16 LAB — OB RESULTS CONSOLE RPR: RPR: NONREACTIVE

## 2019-07-23 IMAGING — US US RENAL
1 series · 14 of 25 positions shown · non-contrast
Comparison: None.

CLINICAL DATA: Right upper quadrant abdominal pain.

EXAM:
RENAL / URINARY TRACT ULTRASOUND COMPLETE

[Series 1: us renal · 14 of 33 slices shown]
[im 1/33]
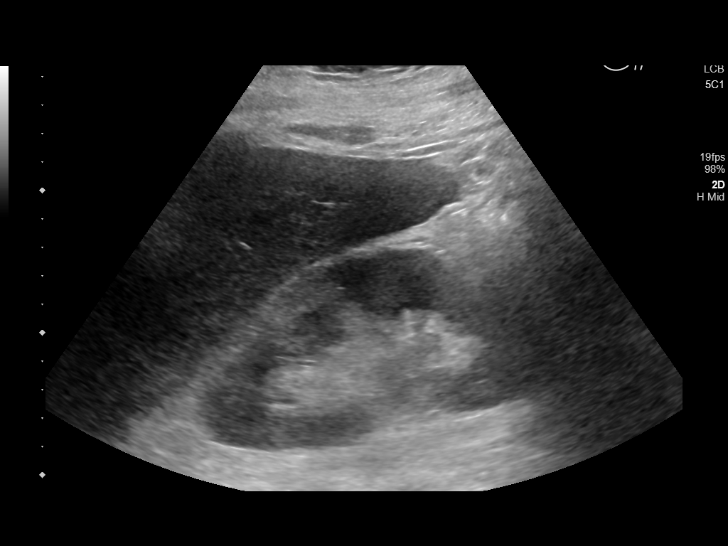
[im 3/33]
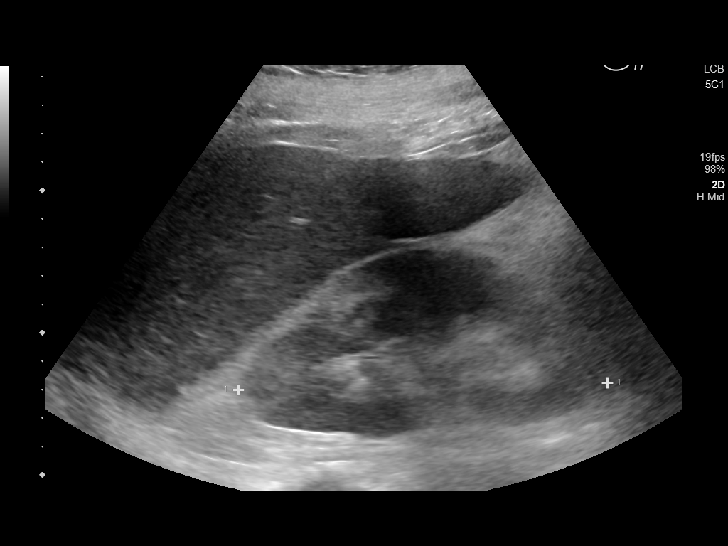
[im 6/33]
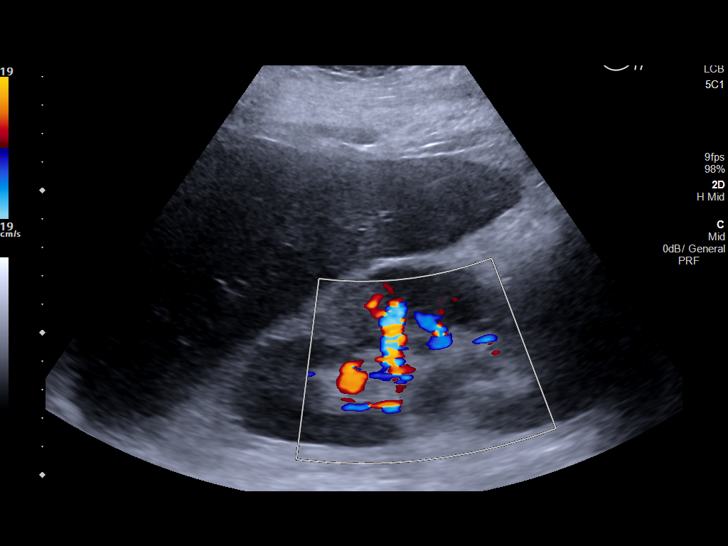
[im 9/33]
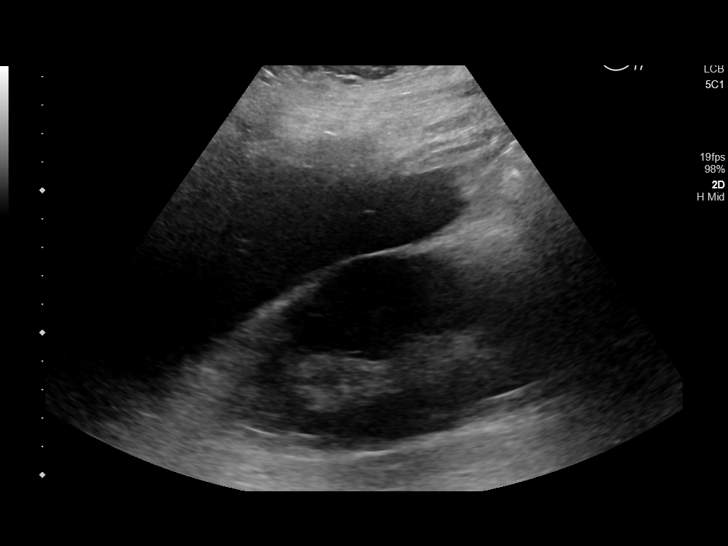
[im 11/33]
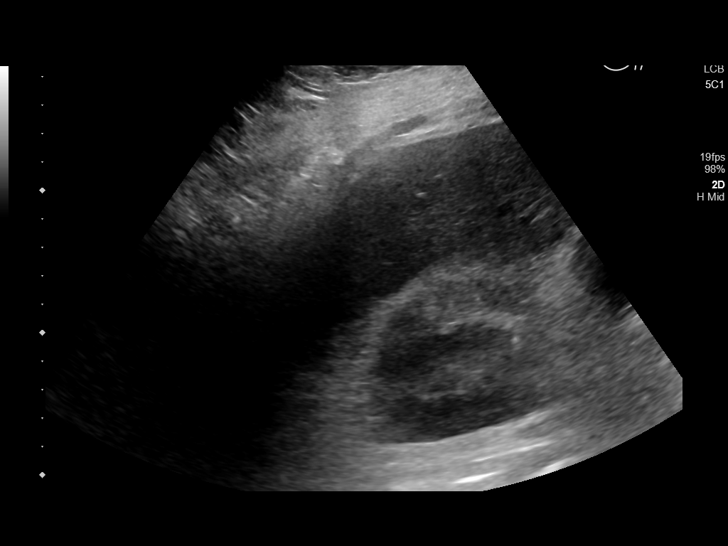
[im 13/33]
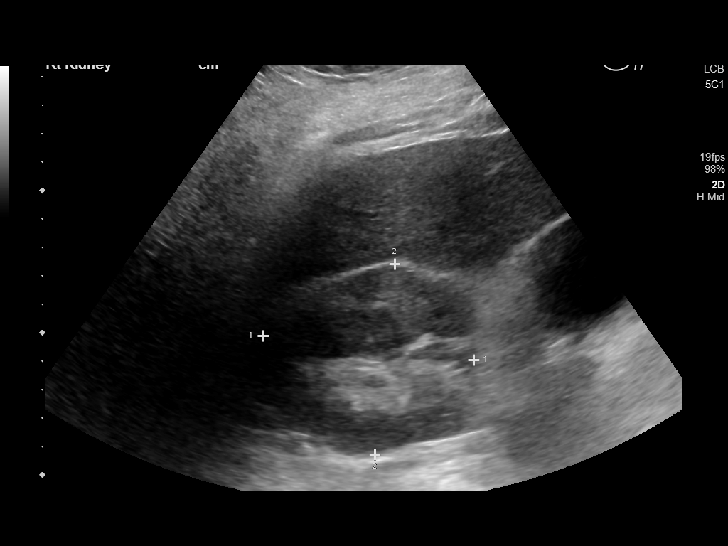
[im 15/33]
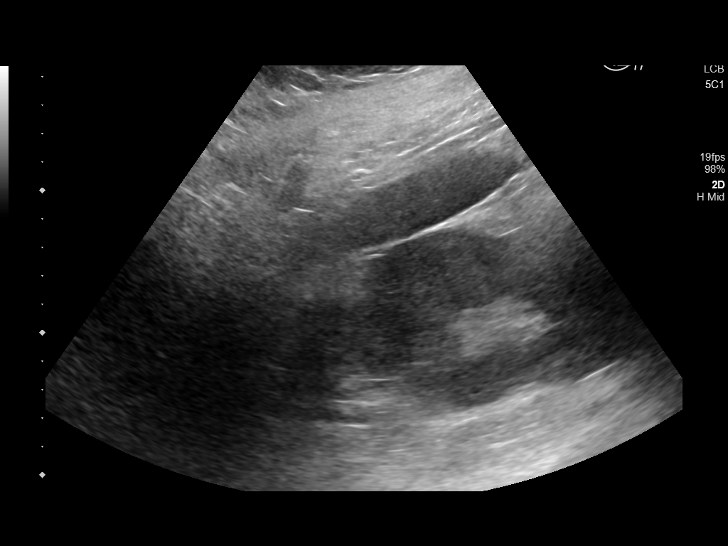
[im 18/33]
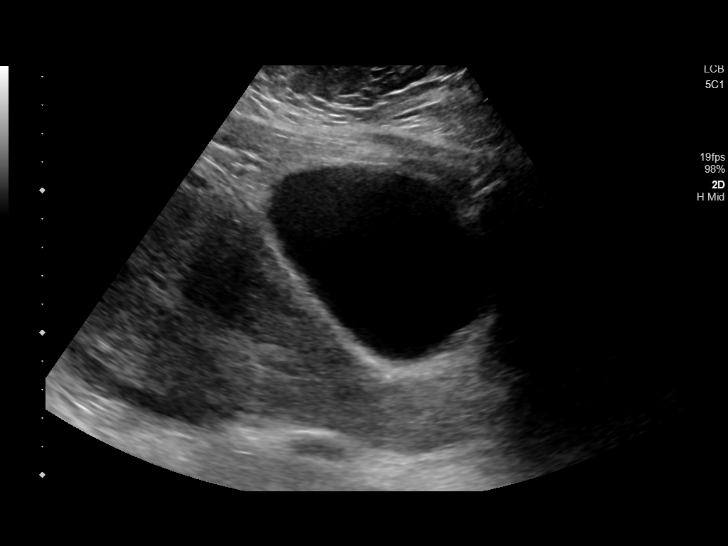
[im 21/33]
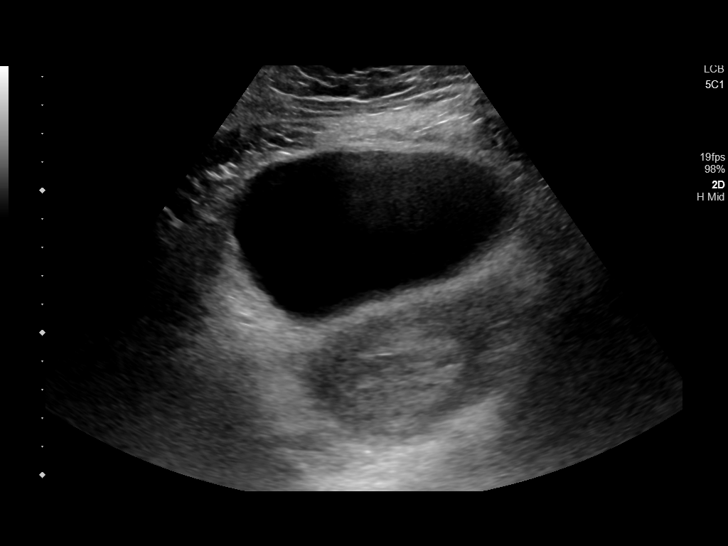
[im 22/33]
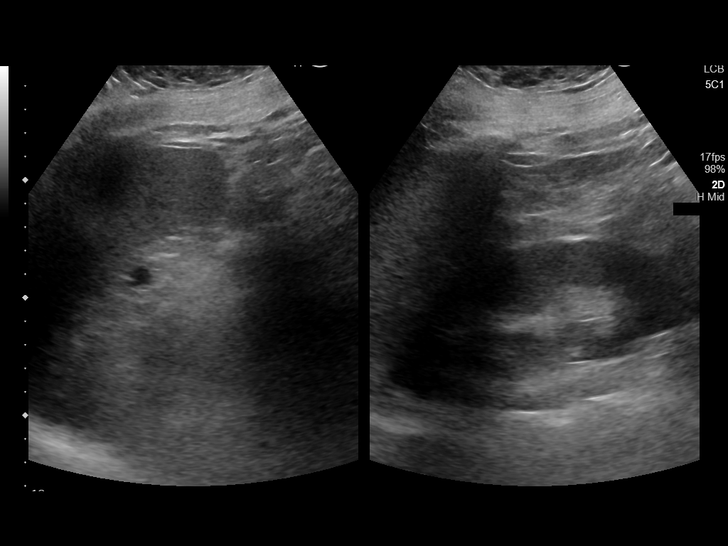
[im 25/33]
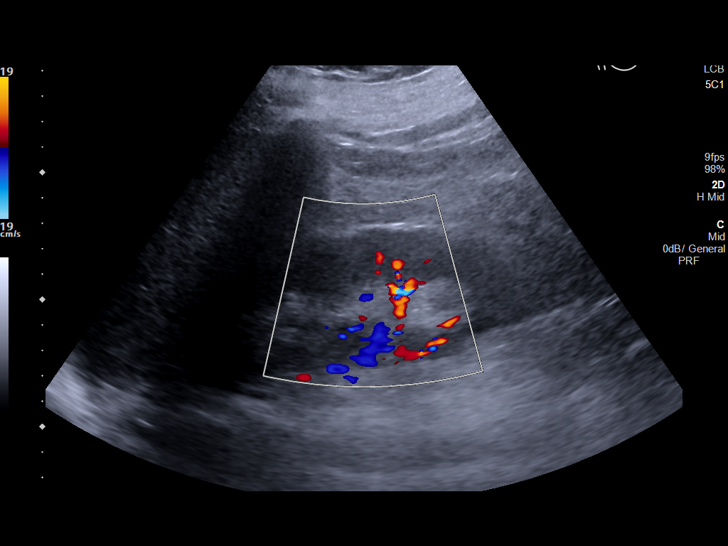
[im 27/33]
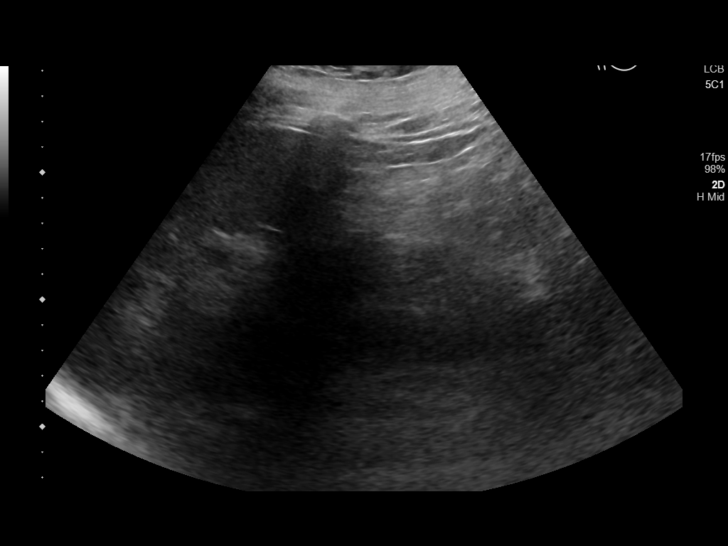
[im 30/33]
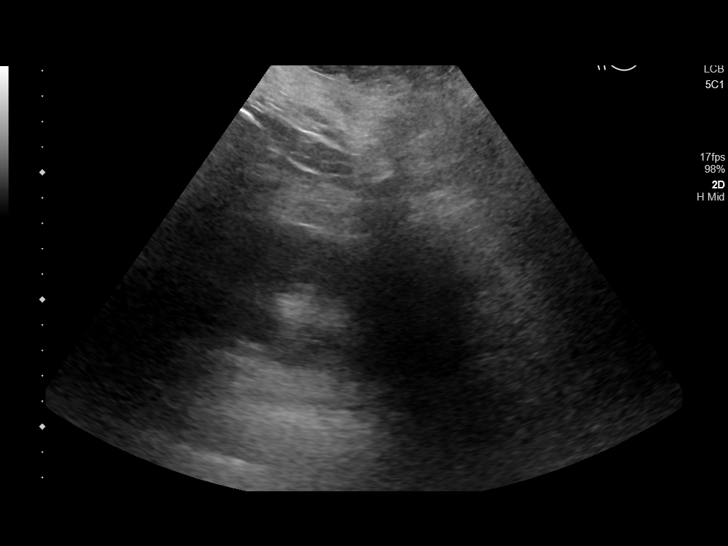
[im 33/33]
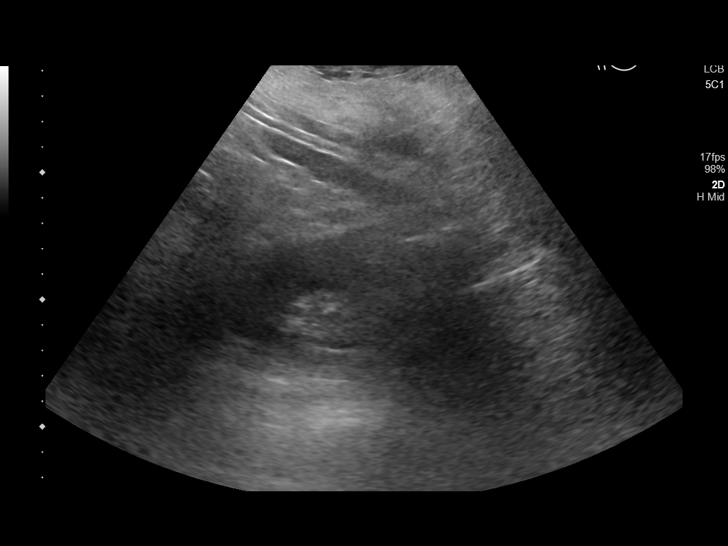

[14 of 25 positions shown; findings below may reference images not displayed]

FINDINGS: Right Kidney:

Renal measurements: 12.9 x 7.4 x 6.7 cm = volume: 340 mL .
Echogenicity within normal limits. No mass visualized. Possible
minimal right hydronephrosis is noted.

Left Kidney:

Renal measurements: 13.9 x 6.7 x 5.2 cm = volume: 256 mL.
Echogenicity within normal limits. No mass or hydronephrosis
visualized.

Bladder:

Appears normal for degree of bladder distention.
IMPRESSION: Possible minimal right hydronephrosis.

## 2019-09-03 ENCOUNTER — Emergency Department
Admission: EM | Admit: 2019-09-03 | Discharge: 2019-09-03 | Disposition: A | Discharge status: Home / Self Care | Payer: 59 | Attending: Emergency Medicine | Admitting: Emergency Medicine

## 2019-09-03 ENCOUNTER — Emergency Department: Payer: 59

## 2019-09-03 ENCOUNTER — Encounter: Payer: Self-pay | Admitting: Emergency Medicine

## 2019-09-03 ENCOUNTER — Other Ambulatory Visit: Payer: Self-pay

## 2019-09-03 DIAGNOSIS — J45909 Unspecified asthma, uncomplicated: Secondary | ICD-10-CM | POA: Insufficient documentation

## 2019-09-03 DIAGNOSIS — J209 Acute bronchitis, unspecified: Secondary | ICD-10-CM | POA: Insufficient documentation

## 2019-09-03 DIAGNOSIS — Z7951 Long term (current) use of inhaled steroids: Secondary | ICD-10-CM | POA: Diagnosis not present

## 2019-09-03 DIAGNOSIS — Z3A30 30 weeks gestation of pregnancy: Secondary | ICD-10-CM | POA: Diagnosis not present

## 2019-09-03 DIAGNOSIS — Z20822 Contact with and (suspected) exposure to covid-19: Secondary | ICD-10-CM | POA: Diagnosis not present

## 2019-09-03 DIAGNOSIS — Z87891 Personal history of nicotine dependence: Secondary | ICD-10-CM | POA: Diagnosis not present

## 2019-09-03 DIAGNOSIS — O99513 Diseases of the respiratory system complicating pregnancy, third trimester: Secondary | ICD-10-CM | POA: Diagnosis not present

## 2019-09-03 LAB — SARS CORONAVIRUS 2 BY RT PCR (HOSPITAL ORDER, PERFORMED IN ~~LOC~~ HOSPITAL LAB): SARS Coronavirus 2: NEGATIVE

## 2019-09-03 MED ORDER — AZITHROMYCIN 250 MG PO TABS: ORAL_TABLET | ORAL | 0 refills | Status: DC

## 2019-09-03 MED ORDER — ALBUTEROL SULFATE (2.5 MG/3ML) 0.083% IN NEBU: 2.5000 mg | INHALATION_SOLUTION | Freq: Four times a day (QID) | RESPIRATORY_TRACT | 12 refills | Status: AC | PRN

## 2019-09-03 MED ORDER — IPRATROPIUM-ALBUTEROL 0.5-2.5 (3) MG/3ML IN SOLN
3.0000 mL | Freq: Once | RESPIRATORY_TRACT | Status: AC
  Administered 2019-09-03: 3 mL via RESPIRATORY_TRACT
  Filled 2019-09-03: qty 3

## 2019-09-03 NOTE — ED Triage Notes (Signed)
Pt in via POV, reports cough, congestion, sob x approximately 4 days.  Pt is [redacted] weeks pregnant, advised per OB to be evaluated here to receive Covid test.    Hx of Asthma.  NAD noted at this time.

## 2019-09-03 NOTE — ED Provider Notes (Signed)
[facility] Emergency Department Provider Note  ____________________________________________   First MD Initiated Contact with Patient 09/03/19 1547     (approximate)  I have reviewed the triage vital signs and the nursing notes.   HISTORY  Chief Complaint URI    HPI Sara Wolf is a 34 y.o. female who is [redacted] weeks pregnant presents emergency department complaining of cough, congestion, shortness of breath for about 4 days.  States cough has become productive with yellow mucus.  She does use the inhaler but is having more wheezing.  She denies fever or chills.  She did have a Covid test last Tuesday which was negative.    Past Medical History:  Diagnosis Date  . Asthma   . Complication of anesthesia    'took me a while to wake up'  . Obesity (BMI 30-39.9)     Patient Active Problem List   Diagnosis Date Noted  . Sepsis (HCC) 08/20/2018  . Severe preeclampsia, third trimester 08/15/2018  . Severe preeclampsia 08/14/2018    Past Surgical History:  Procedure Laterality Date  . BREAST REDUCTION SURGERY      Prior to Admission medications   Medication Sig Start Date End Date Taking? Authorizing Provider  acetaminophen (TYLENOL) 325 MG tablet Take 650 mg by mouth every 6 (six) hours as needed for mild pain or fever.     [provider]  albuterol (PROVENTIL) (2.5 MG/3ML) 0.083% nebulizer solution Take 3 mLs (2.5 mg total) by nebulization every 6 (six) hours as needed for wheezing or shortness of breath. 09/03/19   Vergene Marland, Roselyn Bering, PA-C  albuterol (VENTOLIN HFA) 108 (90 Base) MCG/ACT inhaler Inhale 2 puffs into the lungs every 6 (six) hours as needed for wheezing or shortness of breath.     [provider]  azithromycin (ZITHROMAX Z-PAK) 250 MG tablet 2 pills today then 1 pill a day for 4 days 09/03/19   Sherrie Mustache Roselyn Bering, PA-C  calcium carbonate (TUMS - DOSED IN MG ELEMENTAL CALCIUM) 500 MG chewable tablet Chew 1 tablet by mouth  daily.    [provider]  Fluticasone-Salmeterol (ADVAIR) 250-50 MCG/DOSE AEPB Inhale 1 puff into the lungs 2 (two) times daily.    [provider]  NIFEdipine (ADALAT CC) 60 MG 24 hr tablet Take 1 tablet (60 mg total) by mouth daily. 08/18/18   Meisinger, Tawanna Cooler, MD  Prenatal Vit-Fe Fumarate-FA (PRENATAL MULTIVITAMIN) TABS tablet Take 1 tablet by mouth daily.     [provider]    Allergies Apple, Peach [prunus persica], and Pear  Family History  Problem Relation Age of Onset  . Healthy Mother   . Hypertension Father   . Asthma Father   . Hyperlipidemia Father   . Stroke Maternal Grandmother   . Diabetes Maternal Grandfather   . Cancer Paternal Grandfather     Social History Social History   Tobacco Use  . Smoking status: Former Games developer  . Smokeless tobacco: Never Used  Vaping Use  . Vaping Use: Never used  Substance Use Topics  . Alcohol use: Not Currently  . Drug use: Never    Review of Systems  Constitutional: No fever/chills Eyes: No visual changes. ENT: No sore throat. Respiratory: Positive cough Cardiovascular: Denies chest pain Genitourinary: Negative for dysuria. Musculoskeletal: Negative for back pain. Skin: Negative for rash. Psychiatric: no mood changes,     ____________________________________________   PHYSICAL EXAM:  VITAL SIGNS: ED Triage Vitals [09/03/19 1508]  Enc Vitals Group     BP  124/80     Pulse Rate 91     Resp 17     Temp 100.1 F (37.8 C)     Temp Source Oral     SpO2 97 %     Weight 230 lb (104.3 kg)     Height 5\' 2"  (1.575 m)     Head Circumference      Peak Flow      Pain Score 0     Pain Loc      Pain Edu?      Excl. in GC?     Constitutional: Alert and oriented. Well appearing and in no acute distress. Eyes: Conjunctivae are normal.  Head: Atraumatic. Nose: No congestion/rhinnorhea. Mouth/Throat: Mucous membranes are moist.   Neck:  supple no lymphadenopathy noted Cardiovascular:  Normal rate, regular rhythm. Heart sounds are normal Respiratory: Normal respiratory effort.  No retractions, lungs with wheezing bilateral GU: deferred Musculoskeletal: FROM all extremities, warm and well perfused Neurologic:  Normal speech and language.  Skin:  Skin is warm, dry and intact. No rash noted. Psychiatric: Mood and affect are normal. Speech and behavior are normal.  ____________________________________________   LABS (all labs ordered are listed, but only abnormal results are displayed)  Labs Reviewed  SARS CORONAVIRUS 2 BY RT PCR (HOSPITAL ORDER, PERFORMED IN [place] [facility])   ____________________________________________   ____________________________________________  RADIOLOGY  Chest x-ray  ____________________________________________   PROCEDURES  Procedure(s) performed: DuoNeb   Procedures    ____________________________________________   INITIAL IMPRESSION / ASSESSMENT AND PLAN / ED COURSE  Pertinent labs & imaging results that were available during my care of the patient were reviewed by me and considered in my medical decision making (see chart for details).   The patient was 34 year old 30-week pregnant female presents emergency department for URI symptoms.  See HPI  Physical exam does show the patient has wheezing bilaterally.  Low-grade temp of 100.1, O2 saturations 97% on room air  DuoNeb and Covid test ordered  Covid test negative, patient has increased air movement after the DuoNeb  Chest x-ray ordered, instructed to shield abdomen Chest x-ray is normal per the radiologist  Explained findings to the patient.  She was given a prescription for a Z-Pak and albuterol Nebules.  She is to follow-up with her regular doctor if not improving in 3 days.  Return emergency department worsening.  She states she understands.  She discharged stable condition.     Sara Wolf was evaluated in Emergency Department on 09/03/2019 for the  symptoms described in the history of present illness. She was evaluated in the context of the global COVID-19 pandemic, which necessitated consideration that the patient might be at risk for infection with the SARS-CoV-2 virus that causes COVID-19. Institutional protocols and algorithms that pertain to the evaluation of patients at risk for COVID-19 are in a state of rapid change based on information released by regulatory bodies including the CDC and federal and state organizations. These policies and algorithms were followed during the patient's care in the ED.    As part of my medical decision making, I reviewed the following data within the electronic MEDICAL RECORD NUMBER Nursing notes reviewed and incorporated, Labs reviewed , Old chart reviewed, Radiograph reviewed , Notes from prior ED visits and [place] Controlled Substance Database  ____________________________________________   FINAL CLINICAL IMPRESSION(S) / ED DIAGNOSES  Final diagnoses:  Acute bronchitis, unspecified organism      NEW MEDICATIONS STARTED DURING THIS VISIT:  New Prescriptions  ALBUTEROL (PROVENTIL) (2.5 MG/3ML) 0.083% NEBULIZER SOLUTION    Take 3 mLs (2.5 mg total) by nebulization every 6 (six) hours as needed for wheezing or shortness of breath.   AZITHROMYCIN (ZITHROMAX Z-PAK) 250 MG TABLET    2 pills today then 1 pill a day for 4 days     Note:  This document was prepared using Dragon voice recognition software and may include unintentional dictation errors.    Faythe Ghee, PA-C 09/03/19 Melton Alar, MD 09/03/19 1800

## 2019-09-03 NOTE — Discharge Instructions (Addendum)
Follow-up with your regular doctor if not improving in 3 to 4 days.  Return emergency department worsening.  Take medication as prescribed. 

## 2019-09-03 NOTE — ED Notes (Signed)
See triage note  Presents with wheezing and cough  States she has hx of asthma  Has been using her inhalers and SVN   States she was tested last week for COVID and it was negative  Low grade temp on arrival

## 2019-10-22 LAB — OB RESULTS CONSOLE GBS: GBS: NEGATIVE

## 2019-11-09 ENCOUNTER — Encounter (HOSPITAL_COMMUNITY): Payer: Self-pay | Admitting: *Deleted

## 2019-11-09 ENCOUNTER — Telehealth (HOSPITAL_COMMUNITY): Payer: Self-pay | Admitting: *Deleted

## 2019-11-09 NOTE — Telephone Encounter (Signed)
Preadmission screen  

## 2019-11-12 ENCOUNTER — Telehealth (HOSPITAL_COMMUNITY): Payer: Self-pay | Admitting: *Deleted

## 2019-11-12 ENCOUNTER — Encounter (HOSPITAL_COMMUNITY): Payer: Self-pay | Admitting: *Deleted

## 2019-11-12 NOTE — Telephone Encounter (Signed)
Preadmission screen  

## 2019-11-14 ENCOUNTER — Other Ambulatory Visit (HOSPITAL_COMMUNITY)
Admission: RE | Admit: 2019-11-14 | Discharge: 2019-11-14 | Disposition: A | Discharge status: Home / Self Care | Payer: 59 | Source: Ambulatory Visit | Attending: Obstetrics and Gynecology | Admitting: Obstetrics and Gynecology

## 2019-11-14 DIAGNOSIS — Z01812 Encounter for preprocedural laboratory examination: Secondary | ICD-10-CM | POA: Insufficient documentation

## 2019-11-14 DIAGNOSIS — Z20822 Contact with and (suspected) exposure to covid-19: Secondary | ICD-10-CM | POA: Insufficient documentation

## 2019-11-14 LAB — SARS CORONAVIRUS 2 (TAT 6-24 HRS): SARS Coronavirus 2: NEGATIVE

## 2019-11-15 ENCOUNTER — Other Ambulatory Visit: Payer: Self-pay | Admitting: Obstetrics and Gynecology

## 2019-11-15 NOTE — H&P (Signed)
Sara Wolf is a 34 y.o. female G2P0101 at 58 4/7 weeks (EDD 11/19/19 by 13 week [place] inconsistent with LMP)  presenting for IOL at term with favorable cervix. Prenatal care significant for:  1) History of severe pre-eclampsia  Prior IOL at 35+ weeks for severe preeclampsia Baby ASA daily 2) Chronic Hypertensive disorder  on norvasc prior to pregnancy and d/c with first trimester Restarted labetalol 100mg  po BID at 20 weeks Growth 09/06/19 EFW 57%ile  nml AFI BPP/NST's at 32 weeks Baseline prot:creat ratio 0.16 3) Asthma  inhaler, nebs prn 4) Maternal obesity --BMI 41  OB History    Gravida  2   Para  1   Term      Preterm  1   AB      Living  1     SAB      TAB      Ectopic      Multiple  0   Live Births  1         08-15-2018, 35.4 wks 1. F, 4lbs 13oz, Vaginal Delivery  Past Medical History:  Diagnosis Date  . Asthma   . Complication of anesthesia    'took me a while to wake up'  . History of pre-eclampsia   . Obesity (BMI 30-39.9)   . Pregnancy induced hypertension    Past Surgical History:  Procedure Laterality Date  . BREAST REDUCTION SURGERY     Family History: family history includes Asthma in her father; Cancer in her paternal grandfather; Diabetes in her maternal grandfather; Healthy in her mother; Hyperlipidemia in her father; Hypertension in her father; Stroke in her maternal grandmother. Social History:  reports that she has quit smoking. She has never used smokeless tobacco. She reports previous alcohol use. She reports that she does not use drugs.     Maternal Diabetes: No Genetic Screening: Declined Maternal Ultrasounds/Referrals: Normal Fetal Ultrasounds or other Referrals:  None Maternal Substance Abuse:  No Significant Maternal Medications:  Meds include: Other: labetalol Significant Maternal Lab Results:  Group B Strep negative Other Comments:  None  Review of Systems  Constitutional: Negative for fever.  Cardiovascular:  Negative for chest pain.  Gastrointestinal: Negative for abdominal pain.  Neurological: Negative for headaches.   Maternal Medical History:  Contractions: Frequency: irregular.   Perceived severity is mild.    Fetal activity: Perceived fetal activity is normal.    Prenatal complications: CHTN, obesity, h/o preeclampsia  Prenatal Complications - Diabetes: none.      unknown if currently breastfeeding. Exam Physical Exam  Prenatal labs: ABO, Rh: B/Positive/-- (04/14 0000) Antibody: Negative (04/14 0000) Rubella: Immune (04/14 0000) RPR: Nonreactive (04/14 0000)  HBsAg: Negative (04/14 0000)  HIV: Non-reactive (04/14 0000)  GBS: Negative/-- (09/20 0000)  One hour GCT 114 Essential panel negative  Assessment/Plan: Pt for IOL at term with CHTN that has been completely stable on the same dose of labetalol sinnce began in second trimester.  Plan AROM, pitocin and epidural prn.   WIll check PIH labs on admission 10-18-1996 11/15/2019, 10:34 PM

## 2019-11-15 NOTE — H&P (Deleted)
  The note originally documented on this encounter has been moved the the encounter in which it belongs.  

## 2019-11-16 ENCOUNTER — Inpatient Hospital Stay (HOSPITAL_COMMUNITY): Payer: 59

## 2019-11-17 ENCOUNTER — Inpatient Hospital Stay (HOSPITAL_COMMUNITY): Payer: 59 | Admitting: Anesthesiology

## 2019-11-17 ENCOUNTER — Inpatient Hospital Stay (HOSPITAL_COMMUNITY)
Admission: AD | Admit: 2019-11-17 | Discharge: 2019-11-18 | DRG: 807 | Disposition: A | Discharge status: Home / Self Care | Payer: 59 | Attending: Obstetrics and Gynecology | Admitting: Obstetrics and Gynecology

## 2019-11-17 ENCOUNTER — Other Ambulatory Visit: Payer: Self-pay

## 2019-11-17 ENCOUNTER — Encounter (HOSPITAL_COMMUNITY): Payer: Self-pay | Admitting: Obstetrics and Gynecology

## 2019-11-17 DIAGNOSIS — Z3A39 39 weeks gestation of pregnancy: Secondary | ICD-10-CM | POA: Diagnosis not present

## 2019-11-17 DIAGNOSIS — Z20822 Contact with and (suspected) exposure to covid-19: Secondary | ICD-10-CM | POA: Diagnosis present

## 2019-11-17 DIAGNOSIS — O1002 Pre-existing essential hypertension complicating childbirth: Secondary | ICD-10-CM | POA: Diagnosis present

## 2019-11-17 DIAGNOSIS — O99214 Obesity complicating childbirth: Secondary | ICD-10-CM | POA: Diagnosis present

## 2019-11-17 DIAGNOSIS — O9952 Diseases of the respiratory system complicating childbirth: Secondary | ICD-10-CM | POA: Diagnosis present

## 2019-11-17 DIAGNOSIS — E669 Obesity, unspecified: Secondary | ICD-10-CM | POA: Diagnosis present

## 2019-11-17 DIAGNOSIS — J45909 Unspecified asthma, uncomplicated: Secondary | ICD-10-CM | POA: Diagnosis present

## 2019-11-17 DIAGNOSIS — O26893 Other specified pregnancy related conditions, third trimester: Secondary | ICD-10-CM | POA: Diagnosis present

## 2019-11-17 DIAGNOSIS — O10919 Unspecified pre-existing hypertension complicating pregnancy, unspecified trimester: Secondary | ICD-10-CM | POA: Diagnosis present

## 2019-11-17 DIAGNOSIS — Z87891 Personal history of nicotine dependence: Secondary | ICD-10-CM | POA: Diagnosis not present

## 2019-11-17 LAB — CBC
HCT: 34.5 % — ABNORMAL LOW (ref 36.0–46.0)
HCT: 32.5 % — ABNORMAL LOW (ref 36.0–46.0)
Hemoglobin: 11.5 g/dL — ABNORMAL LOW (ref 12.0–15.0)
Hemoglobin: 10.7 g/dL — ABNORMAL LOW (ref 12.0–15.0)
MCH: 28.3 pg (ref 26.0–34.0)
MCH: 27.9 pg (ref 26.0–34.0)
MCHC: 33.3 g/dL (ref 30.0–36.0)
MCHC: 32.9 g/dL (ref 30.0–36.0)
MCV: 84.8 fL (ref 80.0–100.0)
MCV: 84.6 fL (ref 80.0–100.0)
Platelets: 251 10*3/uL (ref 150–400)
Platelets: 201 10*3/uL (ref 150–400)
RBC: 4.07 MIL/uL (ref 3.87–5.11)
RBC: 3.84 MIL/uL — ABNORMAL LOW (ref 3.87–5.11)
RDW: 13.9 % (ref 11.5–15.5)
RDW: 13.9 % (ref 11.5–15.5)
WBC: 7.8 10*3/uL (ref 4.0–10.5)
WBC: 13.6 10*3/uL — ABNORMAL HIGH (ref 4.0–10.5)
nRBC: 0 % (ref 0.0–0.2)
nRBC: 0 % (ref 0.0–0.2)

## 2019-11-17 LAB — COMPREHENSIVE METABOLIC PANEL
ALT: 10 U/L (ref 0–44)
AST: 17 U/L (ref 15–41)
Albumin: 2.7 g/dL — ABNORMAL LOW (ref 3.5–5.0)
Alkaline Phosphatase: 76 U/L (ref 38–126)
Anion gap: 9 (ref 5–15)
BUN: 10 mg/dL (ref 6–20)
CO2: 20 mmol/L — ABNORMAL LOW (ref 22–32)
Calcium: 9.8 mg/dL (ref 8.9–10.3)
Chloride: 107 mmol/L (ref 98–111)
Creatinine, Ser: 0.79 mg/dL (ref 0.44–1.00)
GFR, Estimated: >60 mL/min (ref >60)
Glucose, Bld: 101 mg/dL — ABNORMAL HIGH (ref 70–99)
Potassium: 3.9 mmol/L (ref 3.5–5.1)
Sodium: 136 mmol/L (ref 135–145)
Total Bilirubin: 0.2 mg/dL — ABNORMAL LOW (ref 0.3–1.2)
Total Protein: 6 g/dL — ABNORMAL LOW (ref 6.5–8.1)

## 2019-11-17 LAB — PROTEIN / CREATININE RATIO, URINE
Creatinine, Urine: 80.25 mg/dL
Protein Creatinine Ratio: 0.07 mg/mg{Cre} (ref 0.00–0.15)
Total Protein, Urine: 6 mg/dL

## 2019-11-17 LAB — RPR: RPR Ser Ql: NONREACTIVE

## 2019-11-17 LAB — TYPE AND SCREEN
ABO/RH(D): B POS
Antibody Screen: NEGATIVE

## 2019-11-17 MED ORDER — ONDANSETRON HCL 4 MG/2ML IJ SOLN: 4.0000 mg | INTRAMUSCULAR | Status: DC | PRN

## 2019-11-17 MED ORDER — COCONUT OIL OIL
1.0000 "application " | TOPICAL_OIL | Status: DC | PRN
  Administered 2019-11-18: 1 via TOPICAL

## 2019-11-17 MED ORDER — OXYTOCIN-SODIUM CHLORIDE 30-0.9 UT/500ML-% IV SOLN
2.5000 [IU]/h | INTRAVENOUS | Status: DC
  Administered 2019-11-17: 2.5 [IU]/h via INTRAVENOUS

## 2019-11-17 MED ORDER — EPHEDRINE 5 MG/ML INJ: 10.0000 mg | INTRAVENOUS | Status: DC | PRN

## 2019-11-17 MED ORDER — LABETALOL HCL 100 MG PO TABS
100.0000 mg | ORAL_TABLET | Freq: Two times a day (BID) | ORAL | Status: DC
  Administered 2019-11-17 – 2019-11-18 (×3): 100 mg via ORAL
  Filled 2019-11-17 (×3): qty 1

## 2019-11-17 MED ORDER — IBUPROFEN 600 MG PO TABS
600.0000 mg | ORAL_TABLET | Freq: Four times a day (QID) | ORAL | Status: DC
  Administered 2019-11-17 – 2019-11-18 (×4): 600 mg via ORAL
  Filled 2019-11-17 (×4): qty 1

## 2019-11-17 MED ORDER — LACTATED RINGERS IV SOLN: 500.0000 mL | INTRAVENOUS | Status: DC | PRN

## 2019-11-17 MED ORDER — PHENYLEPHRINE 40 MCG/ML (10ML) SYRINGE FOR IV PUSH (FOR BLOOD PRESSURE SUPPORT)
80.0000 ug | PREFILLED_SYRINGE | INTRAVENOUS | Status: DC | PRN
  Filled 2019-11-17: qty 10

## 2019-11-17 MED ORDER — SOD CITRATE-CITRIC ACID 500-334 MG/5ML PO SOLN: 30.0000 mL | ORAL | Status: DC | PRN

## 2019-11-17 MED ORDER — WITCH HAZEL-GLYCERIN EX PADS: 1.0000 "application " | MEDICATED_PAD | CUTANEOUS | Status: DC | PRN

## 2019-11-17 MED ORDER — OXYTOCIN BOLUS FROM INFUSION
333.0000 mL | Freq: Once | INTRAVENOUS | Status: AC
  Administered 2019-11-17: 333 mL via INTRAVENOUS

## 2019-11-17 MED ORDER — ALBUTEROL SULFATE HFA 108 (90 BASE) MCG/ACT IN AERS
2.0000 | INHALATION_SPRAY | Freq: Four times a day (QID) | RESPIRATORY_TRACT | Status: DC | PRN
  Filled 2019-11-17: qty 6.7

## 2019-11-17 MED ORDER — SODIUM CHLORIDE (PF) 0.9 % IJ SOLN
INTRAMUSCULAR | Status: DC | PRN
  Administered 2019-11-17: 12 mL/h via EPIDURAL

## 2019-11-17 MED ORDER — ONDANSETRON HCL 4 MG/2ML IJ SOLN: 4.0000 mg | Freq: Four times a day (QID) | INTRAMUSCULAR | Status: DC | PRN

## 2019-11-17 MED ORDER — PRENATAL MULTIVITAMIN CH
1.0000 | ORAL_TABLET | Freq: Every day | ORAL | Status: DC
  Administered 2019-11-18: 1 via ORAL
  Filled 2019-11-17: qty 1

## 2019-11-17 MED ORDER — SENNOSIDES-DOCUSATE SODIUM 8.6-50 MG PO TABS
2.0000 | ORAL_TABLET | ORAL | Status: DC
  Administered 2019-11-18: 2 via ORAL
  Filled 2019-11-17: qty 2

## 2019-11-17 MED ORDER — ZOLPIDEM TARTRATE 5 MG PO TABS: 5.0000 mg | ORAL_TABLET | Freq: Every evening | ORAL | Status: DC | PRN

## 2019-11-17 MED ORDER — SIMETHICONE 80 MG PO CHEW: 80.0000 mg | CHEWABLE_TABLET | ORAL | Status: DC | PRN

## 2019-11-17 MED ORDER — LACTATED RINGERS IV SOLN
500.0000 mL | Freq: Once | INTRAVENOUS | Status: AC
  Administered 2019-11-17: 500 mL via INTRAVENOUS

## 2019-11-17 MED ORDER — TETANUS-DIPHTH-ACELL PERTUSSIS 5-2.5-18.5 LF-MCG/0.5 IM SUSP: 0.5000 mL | Freq: Once | INTRAMUSCULAR | Status: DC

## 2019-11-17 MED ORDER — FENTANYL-BUPIVACAINE-NACL 0.5-0.125-0.9 MG/250ML-% EP SOLN
12.0000 mL/h | EPIDURAL | Status: DC | PRN
  Filled 2019-11-17: qty 250

## 2019-11-17 MED ORDER — OXYCODONE-ACETAMINOPHEN 5-325 MG PO TABS: 1.0000 | ORAL_TABLET | ORAL | Status: DC | PRN

## 2019-11-17 MED ORDER — BENZOCAINE-MENTHOL 20-0.5 % EX AERO
1.0000 "application " | INHALATION_SPRAY | CUTANEOUS | Status: DC | PRN
  Filled 2019-11-17: qty 56

## 2019-11-17 MED ORDER — ONDANSETRON HCL 4 MG PO TABS: 4.0000 mg | ORAL_TABLET | ORAL | Status: DC | PRN

## 2019-11-17 MED ORDER — OXYTOCIN-SODIUM CHLORIDE 30-0.9 UT/500ML-% IV SOLN
1.0000 m[IU]/min | INTRAVENOUS | Status: DC
  Administered 2019-11-17: 2 m[IU]/min via INTRAVENOUS
  Filled 2019-11-17: qty 500

## 2019-11-17 MED ORDER — DIPHENHYDRAMINE HCL 50 MG/ML IJ SOLN: 12.5000 mg | INTRAMUSCULAR | Status: DC | PRN

## 2019-11-17 MED ORDER — LIDOCAINE HCL (PF) 1 % IJ SOLN: 30.0000 mL | INTRAMUSCULAR | Status: DC | PRN

## 2019-11-17 MED ORDER — TERBUTALINE SULFATE 1 MG/ML IJ SOLN: 0.2500 mg | Freq: Once | INTRAMUSCULAR | Status: DC | PRN

## 2019-11-17 MED ORDER — BUTORPHANOL TARTRATE 1 MG/ML IJ SOLN: 1.0000 mg | INTRAMUSCULAR | Status: DC | PRN

## 2019-11-17 MED ORDER — LACTATED RINGERS IV SOLN: INTRAVENOUS | Status: DC

## 2019-11-17 MED ORDER — ACETAMINOPHEN 325 MG PO TABS: 650.0000 mg | ORAL_TABLET | ORAL | Status: DC | PRN

## 2019-11-17 MED ORDER — LIDOCAINE HCL (PF) 1 % IJ SOLN
INTRAMUSCULAR | Status: DC | PRN
  Administered 2019-11-17: 8 mL via EPIDURAL

## 2019-11-17 MED ORDER — ACETAMINOPHEN 325 MG PO TABS
650.0000 mg | ORAL_TABLET | ORAL | Status: DC | PRN
  Administered 2019-11-17: 650 mg via ORAL
  Filled 2019-11-17: qty 2

## 2019-11-17 MED ORDER — DIPHENHYDRAMINE HCL 25 MG PO CAPS: 25.0000 mg | ORAL_CAPSULE | Freq: Four times a day (QID) | ORAL | Status: DC | PRN

## 2019-11-17 MED ORDER — DIBUCAINE (PERIANAL) 1 % EX OINT: 1.0000 "application " | TOPICAL_OINTMENT | CUTANEOUS | Status: DC | PRN

## 2019-11-17 MED ORDER — OXYCODONE-ACETAMINOPHEN 5-325 MG PO TABS: 2.0000 | ORAL_TABLET | ORAL | Status: DC | PRN

## 2019-11-17 MED ORDER — PHENYLEPHRINE 40 MCG/ML (10ML) SYRINGE FOR IV PUSH (FOR BLOOD PRESSURE SUPPORT): 80.0000 ug | PREFILLED_SYRINGE | INTRAVENOUS | Status: DC | PRN

## 2019-11-17 NOTE — Progress Notes (Signed)
Patient ID: Sara Wolf, female   DOB: 12/06/1985, 34 y.o.   MRN: [id number] Pt finally making it in for IOL from yesterday AM.  She is feeling mild contractions.  No HA or PIH sx.  Taking her scheduled labetalol.   afeb BP WNL 109-130/71-84    Cervix 3/60/-2 AROM clear   Pitocin for IOL Epidural prn PIH labs WNL, CHTN well-controlled

## 2019-11-17 NOTE — Anesthesia Preprocedure Evaluation (Signed)
Anesthesia Evaluation  Patient identified by MRN, date of birth, ID band Patient awake    Reviewed: Allergy & Precautions, NPO status , Patient's Chart, lab work & pertinent test results  History of Anesthesia Complications (+) PROLONGED EMERGENCE  Airway Mallampati: II  TM Distance: >3 FB Neck ROM: Full    Dental no notable dental hx.    Pulmonary asthma , former smoker,    Pulmonary exam normal breath sounds clear to auscultation       Cardiovascular hypertension, negative cardio ROS Normal cardiovascular exam Rhythm:Regular Rate:Normal     Neuro/Psych negative neurological ROS  negative psych ROS   GI/Hepatic negative GI ROS, Neg liver ROS,   Endo/Other  negative endocrine ROS  Renal/GU negative Renal ROS  negative genitourinary   Musculoskeletal negative musculoskeletal ROS (+)   Abdominal   Peds negative pediatric ROS (+)  Hematology negative hematology ROS (+)   Anesthesia Other Findings   Reproductive/Obstetrics (+) Pregnancy                             Anesthesia Physical Anesthesia Plan  ASA: II  Anesthesia Plan: Epidural   Post-op Pain Management:    Induction:   PONV Risk Score and Plan: 2  Airway Management Planned:   Additional Equipment:   Intra-op Plan:   Post-operative Plan:   Informed Consent: I have reviewed the patients History and Physical, chart, labs and discussed the procedure including the risks, benefits and alternatives for the proposed anesthesia with the patient or authorized representative who has indicated his/her understanding and acceptance.       Plan Discussed with:   Anesthesia Plan Comments:         Anesthesia Quick Evaluation

## 2019-11-17 NOTE — Anesthesia Postprocedure Evaluation (Signed)
Anesthesia Post Note  Patient: Loleta Books  Procedure(s) Performed: AN AD HOC LABOR EPIDURAL     Patient location during evaluation: Mother Baby Anesthesia Type: Epidural Level of consciousness: awake and alert and oriented Pain management: satisfactory to patient Vital Signs Assessment: post-procedure vital signs reviewed and stable Respiratory status: respiratory function stable Cardiovascular status: stable Postop Assessment: no headache, no backache, epidural receding, patient able to bend at knees, no signs of nausea or vomiting, adequate PO intake and able to ambulate Anesthetic complications: no   No complications documented.  Last Vitals:  Vitals:   11/17/19 1300 11/17/19 1412  BP: 111/65 (!) 92/27  Pulse: (!) 103 90  Resp: 16 16  Temp:    SpO2:      Last Pain:  Vitals:   11/17/19 1412  TempSrc:   PainSc: 2    Pain Goal:                   Aveya Beal

## 2019-11-17 NOTE — Anesthesia Procedure Notes (Signed)
Epidural Patient location during procedure: OB Start time: 11/17/2019 7:19 AM End time: 11/17/2019 7:26 AM  Staffing Anesthesiologist: Mellody Dance, MD Performed: anesthesiologist   Preanesthetic Checklist Completed: patient identified, IV checked, site marked, risks and benefits discussed, monitors and equipment checked, pre-op evaluation and timeout performed  Epidural Patient position: sitting Prep: DuraPrep Patient monitoring: heart rate, cardiac monitor, continuous pulse ox and blood pressure Approach: midline Location: L2-L3 Injection technique: LOR saline  Needle:  Needle type: Tuohy  Needle gauge: 17 G Needle length: 9 cm Needle insertion depth: 7 cm Catheter type: closed end flexible Catheter size: 20 Guage Catheter at skin depth: 12 cm Test dose: negative and Other  Assessment Events: blood not aspirated, injection not painful, no injection resistance and negative IV test  Additional Notes Informed consent obtained prior to proceeding including risk of failure, 1% risk of PDPH, risk of minor discomfort and bruising.  Discussed rare but serious complications including epidural abscess, permanent nerve injury, epidural hematoma.  Discussed alternatives to epidural analgesia and patient desires to proceed.  Timeout performed pre-procedure verifying patient name, procedure, and platelet count.  Patient tolerated procedure well.

## 2019-11-17 NOTE — Progress Notes (Signed)
Patient ID: Sara Wolf, female   DOB: May 21, 1985, 34 y.o.   MRN: [id number] Feeling a small amount of pressure  afeb VSS FHR with mild variable decelerations with contractions  cervix c/8/-1  FSE placed and will follow FHR closely Placed on peanut and will recheck when feeling more pressure

## 2019-11-17 NOTE — Progress Notes (Signed)
Patient ID: Sara Wolf, female   DOB: [birth year], 34 y.o.   MRN: [id number] Pt comfortable with epidural  afeb VSS FHR category 1 overall with some occasional mild variables  80/5/-1  IUPC placed and will follow progess

## 2019-11-17 NOTE — Plan of Care (Signed)
  Problem: Education: Goal: Knowledge of Childbirth will improve Outcome: Adequate for Discharge Goal: Ability to make informed decisions regarding treatment and plan of care will improve Outcome: Adequate for Discharge Goal: Ability to state and carry out methods to decrease the pain will improve Outcome: Adequate for Discharge Goal: Individualized Educational Video(s) Outcome: Adequate for Discharge   

## 2019-11-18 LAB — CBC
HCT: 29.8 % — ABNORMAL LOW (ref 36.0–46.0)
Hemoglobin: 9.8 g/dL — ABNORMAL LOW (ref 12.0–15.0)
MCH: 28.1 pg (ref 26.0–34.0)
MCHC: 32.9 g/dL (ref 30.0–36.0)
MCV: 85.4 fL (ref 80.0–100.0)
Platelets: 197 10*3/uL (ref 150–400)
RBC: 3.49 MIL/uL — ABNORMAL LOW (ref 3.87–5.11)
RDW: 14.1 % (ref 11.5–15.5)
WBC: 9.5 10*3/uL (ref 4.0–10.5)
nRBC: 0 % (ref 0.0–0.2)

## 2019-11-18 MED ORDER — ACETAMINOPHEN 325 MG PO TABS: 650.0000 mg | ORAL_TABLET | ORAL | 1 refills | Status: AC | PRN

## 2019-11-18 MED ORDER — IBUPROFEN 600 MG PO TABS: 600.0000 mg | ORAL_TABLET | Freq: Four times a day (QID) | ORAL | 0 refills | Status: AC

## 2019-11-18 NOTE — Progress Notes (Signed)
Post Partum Day 1 Subjective: no complaints, up ad lib and tolerating PO  Objective: Blood pressure 111/68, pulse 70, temperature 97.8 F (36.6 C), temperature source Oral, resp. rate 18, height 5\' 2"  (1.575 m), weight 108 kg, SpO2 100 %, unknown if currently breastfeeding.  Physical Exam:  General: alert and cooperative Lochia: appropriate Uterine Fundus: firm   Recent Labs    11/17/19 1435 11/18/19 0421  HGB 10.7* 9.8*  HCT 32.5* 29.8*    Assessment/Plan: Discharge home if baby able to go Blood pressure looks great on labetalol, will have her come for BP check ion one week   LOS: 1 day   11/20/19 11/18/2019, 9:57 AM

## 2019-11-18 NOTE — Discharge Summary (Signed)
Postpartum Discharge Summary       Patient Name: Sara Wolf DOB: 04/11/1985 MRN: [id number]  Date of admission: 11/17/2019 Delivery date:11/17/2019  Delivering provider: Huel Cote  Date of discharge: 11/18/2019  Admitting diagnosis: Chronic pre-existing hypertension during pregnancy [O10.919] NSVD (normal spontaneous vaginal delivery) [O80] Intrauterine pregnancy: [redacted]w[redacted]d     Secondary diagnosis:  Active Problems:   Chronic pre-existing hypertension during pregnancy   NSVD (normal spontaneous vaginal delivery)  Additional problems: none    Discharge diagnosis: Term Pregnancy Delivered and CHTN                                              Post partum procedures:none Augmentation: AROM and Pitocin Complications: None  Hospital course: Induction of Labor With Vaginal Delivery   34 y.o. yo [id number] at [redacted]w[redacted]d was admitted to the hospital 11/17/2019 for induction of labor.  Indication for induction: Favorable cervix at term and CHTN.  Patient had an uncomplicated labor course as follows: Membrane Rupture Time/Date: 6:04 AM ,11/17/2019   Delivery Method:Vaginal, Spontaneous  Episiotomy: None  Lacerations:  2nd degree;Perineal  Details of delivery can be found in separate delivery note.  Patient had a routine postpartum course. Patient is discharged home 11/18/19.  Newborn Data: Birth date:11/17/2019  Birth time:10:48 AM  Gender:Female  Living status:Living  Apgars:9 ,9  Weight:3294 g   Magnesium Sulfate received: No BMZ received: No Rhophylac:No   Physical exam  Vitals:   11/17/19 1651 11/17/19 2133 11/18/19 0601 11/18/19 1035  BP: 120/66 118/66 111/68 111/67  Pulse:  75 70   Resp: 18 18 18 18   Temp: 98.5 F (36.9 C) 98.4 F (36.9 C) 97.8 F (36.6 C) 98.5 F (36.9 C)  TempSrc: Oral Oral Oral Oral  SpO2: 100%     Weight:      Height:       General: alert and cooperative Lochia: appropriate Uterine Fundus: firm  Labs: Lab Results  Component  Value Date   WBC 9.5 11/18/2019   HGB 9.8 (L) 11/18/2019   HCT 29.8 (L) 11/18/2019   MCV 85.4 11/18/2019   PLT 197 11/18/2019   CMP Latest Ref Rng & Units 11/17/2019  Glucose 70 - 99 mg/dL 11/19/2019)  BUN 6 - 20 mg/dL 10  Creatinine 130(Q - 6.57 mg/dL 8.46  Sodium 9.62 - 952 mmol/L 136  Potassium 3.5 - 5.1 mmol/L 3.9  Chloride 98 - 111 mmol/L 107  CO2 22 - 32 mmol/L 20(L)  Calcium 8.9 - 10.3 mg/dL 9.8  Total Protein 6.5 - 8.1 g/dL 6.0(L)  Total Bilirubin 0.3 - 1.2 mg/dL 841)  Alkaline Phos 38 - 126 U/L 76  AST 15 - 41 U/L 17  ALT 0 - 44 U/L 10   Edinburgh Score: Edinburgh Postnatal Depression Scale Screening Tool 11/17/2019  I have been able to laugh and see the funny side of things. 0  I have looked forward with enjoyment to things. 0  I have blamed myself unnecessarily when things went wrong. 0  I have been anxious or worried for no good reason. 0  I have felt scared or panicky for no good reason. 0  Things have been getting on top of me. 0  I have been so unhappy that I have had difficulty sleeping. 0  I have felt sad or miserable. 0  I have been so unhappy that I  have been crying. 0  The thought of harming myself has occurred to me. 0  Edinburgh Postnatal Depression Scale Total 0     After visit meds:  Allergies as of 11/18/2019      Reactions   Apple Itching   Peach [prunus Persica] Itching   Pear Itching      Medication List    STOP taking these medications   aspirin EC 81 MG tablet   azithromycin 250 MG tablet Commonly known as: Zithromax Z-Pak   calcium carbonate 500 MG chewable tablet Commonly known as: TUMS - dosed in mg elemental calcium   NIFEdipine 60 MG 24 hr tablet Commonly known as: ADALAT CC     TAKE these medications   acetaminophen 325 MG tablet Commonly known as: Tylenol Take 2 tablets (650 mg total) by mouth every 4 (four) hours as needed (for pain scale < 4). What changed:   when to take this  reasons to take this   albuterol  108 (90 Base) MCG/ACT inhaler Commonly known as: VENTOLIN HFA Inhale 2 puffs into the lungs every 6 (six) hours as needed for wheezing or shortness of breath.   albuterol (2.5 MG/3ML) 0.083% nebulizer solution Commonly known as: PROVENTIL Take 3 mLs (2.5 mg total) by nebulization every 6 (six) hours as needed for wheezing or shortness of breath.   Fluticasone-Salmeterol 250-50 MCG/DOSE Aepb Commonly known as: ADVAIR Inhale 1 puff into the lungs 2 (two) times daily.   ibuprofen 600 MG tablet Commonly known as: ADVIL Take 1 tablet (600 mg total) by mouth every 6 (six) hours.   labetalol 100 MG tablet Commonly known as: NORMODYNE Take 100 mg by mouth 2 (two) times daily.   prenatal multivitamin Tabs tablet Take 1 tablet by mouth daily.        Discharge home in stable condition Infant Feeding: Breast Infant Disposition:home with mother Discharge instruction: per After Visit Summary and Postpartum booklet. Activity: Advance as tolerated. Pelvic rest for 6 weeks.  Diet: routine diet Future Appointments:No future appointments. Follow up Visit:  Follow-up Information    Huel Cote, MD. Schedule an appointment as soon as possible for a visit in 1 week(s).   Specialty: Obstetrics and Gynecology Why: blood pressure check Contact information: [address] [place] [address] [phone]                Please schedule this patient for a In person postpartum visit in 1 week with the following provider: MD.  Delivery mode:  Vaginal, Spontaneous  Anticipated Birth Control:  IUD   11/18/2019 Oliver Pila, MD

## 2019-11-18 NOTE — Lactation Note (Signed)
This note was copied from a baby's chart. Lactation Consultation Note  Patient Name: Sara Wolf [id number] Date: 11/18/2019 Reason for consult: Initial assessment;Term;Infant weight loss  Visited with mom of a 36 hours old FT female, she's a P2. She BF her first baby for 1 1/2 months but it was mostly pumping and bottle feeding. 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  Mom is already familiar with hand expression and able to get colostrum when doing so. However, she's been supplementing with Similac 20 calorie formula since birth per feeding choice on admission. Mom told LC that baby is already taking 30 ml/feeding, reviewed formula supplementation guidelines according to baby's age in hours.  Reminded parents that baby is also cluster feeding and that constant suckling at the breast is required for the onset of lactogenesis II. Mom aware of baby's tongue tied and that she can also pump & bottle feed if her nipples get too sore to put baby to breast. Reviewed normal newborn behavior, cluster feeding, feeding cues and size of baby's stomach.  Feeding plan:  1. Encouraged mom to feed baby STS 8-12 times/24 hours or sooner if feeding cues are present 2. Parents will continue supplementing with Similac 20 calorie formula per feeding choice on admission  3. Mom will also pump if her nipples are too sore to put baby to breast, or when baby is getting formula; she has a DEBP at home  BF brochure, BF resources and feeding diary were reviewed. Dad present and supportive. Parents reported all questions and concerns were answered, they're both aware of LC OP services and will call PRN.   Maternal Data Formula Feeding for Exclusion: Yes Reason for exclusion: Mother's choice to formula and breast feed on admission Has patient been taught  Hand Expression?: Yes Does the patient have breastfeeding experience prior to this delivery?: Yes  Feeding    LATCH Score                   Interventions Interventions: Breast feeding basics reviewed  Lactation Tools Discussed/Used WIC Program: No   Consult Status Consult Status: Complete    Sara Wolf 11/18/2019, 3:18 PM

## 2019-11-19 ENCOUNTER — Encounter (HOSPITAL_COMMUNITY): Payer: 59

## 2020-10-07 ENCOUNTER — Emergency Department
Admission: EM | Admit: 2020-10-07 | Discharge: 2020-10-07 | Disposition: A | Discharge status: Home / Self Care | Payer: 59 | Attending: Emergency Medicine | Admitting: Emergency Medicine

## 2020-10-07 ENCOUNTER — Emergency Department: Payer: 59

## 2020-10-07 ENCOUNTER — Other Ambulatory Visit: Payer: Self-pay

## 2020-10-07 DIAGNOSIS — N644 Mastodynia: Secondary | ICD-10-CM | POA: Diagnosis not present

## 2020-10-07 DIAGNOSIS — Z87891 Personal history of nicotine dependence: Secondary | ICD-10-CM | POA: Diagnosis not present

## 2020-10-07 DIAGNOSIS — R079 Chest pain, unspecified: Secondary | ICD-10-CM | POA: Insufficient documentation

## 2020-10-07 DIAGNOSIS — Z7951 Long term (current) use of inhaled steroids: Secondary | ICD-10-CM | POA: Diagnosis not present

## 2020-10-07 DIAGNOSIS — J45909 Unspecified asthma, uncomplicated: Secondary | ICD-10-CM | POA: Diagnosis not present

## 2020-10-07 LAB — BASIC METABOLIC PANEL
Anion gap: 12 (ref 5–15)
BUN: 10 mg/dL (ref 6–20)
CO2: 21 mmol/L — ABNORMAL LOW (ref 22–32)
Calcium: 10 mg/dL (ref 8.9–10.3)
Chloride: 103 mmol/L (ref 98–111)
Creatinine, Ser: 0.79 mg/dL (ref 0.44–1.00)
GFR, Estimated: >60 mL/min (ref >60)
Glucose, Bld: 105 mg/dL — ABNORMAL HIGH (ref 70–99)
Potassium: 3.6 mmol/L (ref 3.5–5.1)
Sodium: 136 mmol/L (ref 135–145)

## 2020-10-07 LAB — CBC
HCT: 44.4 % (ref 36.0–46.0)
Hemoglobin: 15.9 g/dL — ABNORMAL HIGH (ref 12.0–15.0)
MCH: 29 pg (ref 26.0–34.0)
MCHC: 35.8 g/dL (ref 30.0–36.0)
MCV: 80.9 fL (ref 80.0–100.0)
Platelets: 297 10*3/uL (ref 150–400)
RBC: 5.49 MIL/uL — ABNORMAL HIGH (ref 3.87–5.11)
RDW: 12.9 % (ref 11.5–15.5)
WBC: 8.8 10*3/uL (ref 4.0–10.5)
nRBC: 0 % (ref 0.0–0.2)

## 2020-10-07 LAB — TROPONIN I (HIGH SENSITIVITY): Troponin I (High Sensitivity): 3 ng/L (ref <18)

## 2020-10-07 NOTE — ED Triage Notes (Signed)
Pt reports that she developed left chest pain that woke her up in a cold sweat with nausea. Denies SHOB.

## 2020-10-07 NOTE — ED Provider Notes (Signed)
[facility]  ____________________________________________   Event Date/Time   First MD Initiated Contact with Patient 10/07/20 1834     (approximate)  I have reviewed the triage vital signs and the nursing notes.   HISTORY  Chief Complaint Breast Pain and Chest Pain    HPI Sara Wolf is a 35 y.o. female hypertension, obesity, asthma who presents with chest pain.  Symptoms started yesterday.  She endorses a sharp sensation on the left side of her chest as well as intermittent burning feeling.  With is intermittent lasts for minutes at a time.  Is not associated with nausea diaphoresis or dyspnea.  This morning she woke up in a cold sweat but otherwise has not had ongoing fevers chills or cough. The patient denies hx of prior DVT/PE, unilateral leg pain/swelling, hormone use, recent surgery, hx of cancer, prolonged immobilization, or hemoptysis.  Pain is not worse with exertion.  Felt worsening pain when she lifted her son today.          Past Medical History:  Diagnosis Date   Asthma    Complication of anesthesia    'took me a while to wake up'   History of pre-eclampsia    Obesity (BMI 30-39.9)    Pregnancy induced hypertension     Patient Active Problem List   Diagnosis Date Noted   Chronic pre-existing hypertension during pregnancy 11/17/2019   NSVD (normal spontaneous vaginal delivery) 11/17/2019   Sepsis (HCC) 08/20/2018   Severe preeclampsia, third trimester 08/15/2018   Severe preeclampsia 08/14/2018    Past Surgical History:  Procedure Laterality Date   BREAST REDUCTION SURGERY      Prior to Admission medications   Medication Sig Start Date End Date Taking? Authorizing Provider  acetaminophen (TYLENOL) 325 MG tablet Take 2 tablets (650 mg total) by mouth every 4 (four) hours as needed (for pain scale < 4). 11/18/19   Huel Cote, MD  albuterol (PROVENTIL) (2.5 MG/3ML) 0.083% nebulizer solution Take 3 mLs (2.5 mg total)  by nebulization every 6 (six) hours as needed for wheezing or shortness of breath. 09/03/19   Fisher, Roselyn Bering, PA-C  albuterol (VENTOLIN HFA) 108 (90 Base) MCG/ACT inhaler Inhale 2 puffs into the lungs every 6 (six) hours as needed for wheezing or shortness of breath.     [provider]  Fluticasone-Salmeterol (ADVAIR) 250-50 MCG/DOSE AEPB Inhale 1 puff into the lungs 2 (two) times daily.    [provider]  ibuprofen (ADVIL) 600 MG tablet Take 1 tablet (600 mg total) by mouth every 6 (six) hours. 11/18/19   Huel Cote, MD  labetalol (NORMODYNE) 100 MG tablet Take 100 mg by mouth 2 (two) times daily. 10/22/19   [provider]  Prenatal Vit-Fe Fumarate-FA (PRENATAL MULTIVITAMIN) TABS tablet Take 1 tablet by mouth daily.     [provider]    Allergies Apple, Peach [prunus persica], and Pear  Family History  Problem Relation Age of Onset   Healthy Mother    Hypertension Father    Asthma Father    Hyperlipidemia Father    Stroke Maternal Grandmother    Diabetes Maternal Grandfather    Cancer Paternal Grandfather     Social History Social History   Tobacco Use   Smoking status: Former   Smokeless tobacco: Never  Building services engineer Use: Never used  Substance Use Topics   Alcohol use: Not Currently   Drug use: Never    Review of Systems   Review  of Systems  Constitutional:  Positive for appetite change. Negative for chills, fatigue and fever.  Respiratory:  Positive for chest tightness. Negative for shortness of breath.   Cardiovascular:  Positive for chest pain. Negative for palpitations and leg swelling.  Gastrointestinal:  Negative for abdominal pain, nausea and vomiting.  All other systems reviewed and are negative.  Physical Exam Updated Vital Signs BP (!) 140/109 (BP Location: Right Arm)   Pulse (!) 108   Temp 98.6 F (37 C) (Oral)   Resp 18   Ht 5\' 2"  (1.575 m)   Wt 106.6 kg   LMP  (LMP Unknown) Comment: pt has IUD   SpO2 99%   BMI 42.98 kg/m   Physical Exam Vitals and nursing note reviewed.  Constitutional:      General: She is not in acute distress.    Appearance: Normal appearance.  HENT:     Head: Normocephalic and atraumatic.  Eyes:     General: No scleral icterus.    Conjunctiva/sclera: Conjunctivae normal.  Cardiovascular:     Heart sounds: Normal heart sounds.  Pulmonary:     Effort: Pulmonary effort is normal. No respiratory distress.     Breath sounds: No stridor.  Musculoskeletal:        General: No deformity or signs of injury.     Cervical back: Normal range of motion.     Right lower leg: No edema.     Left lower leg: No edema.  Skin:    General: Skin is dry.     Coloration: Skin is not jaundiced or pale.  Neurological:     General: No focal deficit present.     Mental Status: She is alert and oriented to person, place, and time. Mental status is at baseline.  Psychiatric:        Mood and Affect: Mood normal.        Behavior: Behavior normal.     LABS (all labs ordered are listed, but only abnormal results are displayed)  Labs Reviewed  BASIC METABOLIC PANEL - Abnormal; Notable for the following components:      Result Value   CO2 21 (*)    Glucose, Bld 105 (*)    All other components within normal limits  CBC - Abnormal; Notable for the following components:   RBC 5.49 (*)    Hemoglobin 15.9 (*)    All other components within normal limits  POC URINE PREG, ED  TROPONIN I (HIGH SENSITIVITY)   ____________________________________________  EKG  Normal sinus rhythm, normal intervals, normal axis, no acute ischemic changes ____________________________________________  RADIOLOGY , personally viewed and evaluated these images (plain radiographs) as part of my medical decision making, as well as reviewing the written report by the radiologist.  ED MD interpretation: The chest x-ray which does not show any acute cardiopulmonary  process    ____________________________________________   PROCEDURES  Procedure(s) performed (including Critical Care):  Procedures   ____________________________________________   INITIAL IMPRESSION / ASSESSMENT AND PLAN / ED COURSE     Patient is a 35 year old female who presents with 1 day of chest pain.  Vital signs are notable for mild tachycardia on arrival to 108 but normal O2 sat and otherwise normal vital signs.  She is anxious appearing but no acute distress.  Exam is reassuring with normal lung sounds no evidence of DVT.  Her EKG is normal her chest x-ray does not show any acute infiltrate and labs obtained in triage are notable for  a normal troponin of 3.  The nature of her pain is reassuring and that it has no associated symptoms and seems to be somewhat musculoskeletal in nature.  She does not PERC out given her heart rate of 108 however at the time of my evaluation her heart rate is 92 without intervention and she otherwise has no risk factors for PE he is not short of breath and not hypoxic so I would not pursue this any further.  Patient is otherwise stable for discharge.  We discussed Tylenol and Motrin for pain and antacid for potential reflux.      ____________________________________________   FINAL CLINICAL IMPRESSION(S) / ED DIAGNOSES  Final diagnoses:  Chest pain, unspecified type     ED Discharge Orders     None        Note:  This document was prepared using Dragon voice recognition software and may include unintentional dictation errors.    Georga Hacking, MD 10/07/20 1900

## 2020-10-07 NOTE — Discharge Instructions (Addendum)
Your EKG and blood work were reassuring today.  If you have any symptoms that are new and concerning to you, please return to the emergency department.  Please also follow-up with your primary care provider as needed.

## 2021-09-09 IMAGING — CR DG CHEST 2V
2 series · 2 of 2 positions shown · non-contrast
Comparison: 09/03/2019.

CLINICAL DATA: chest pain

EXAM:
CHEST - 2 VIEW

[chest pa]
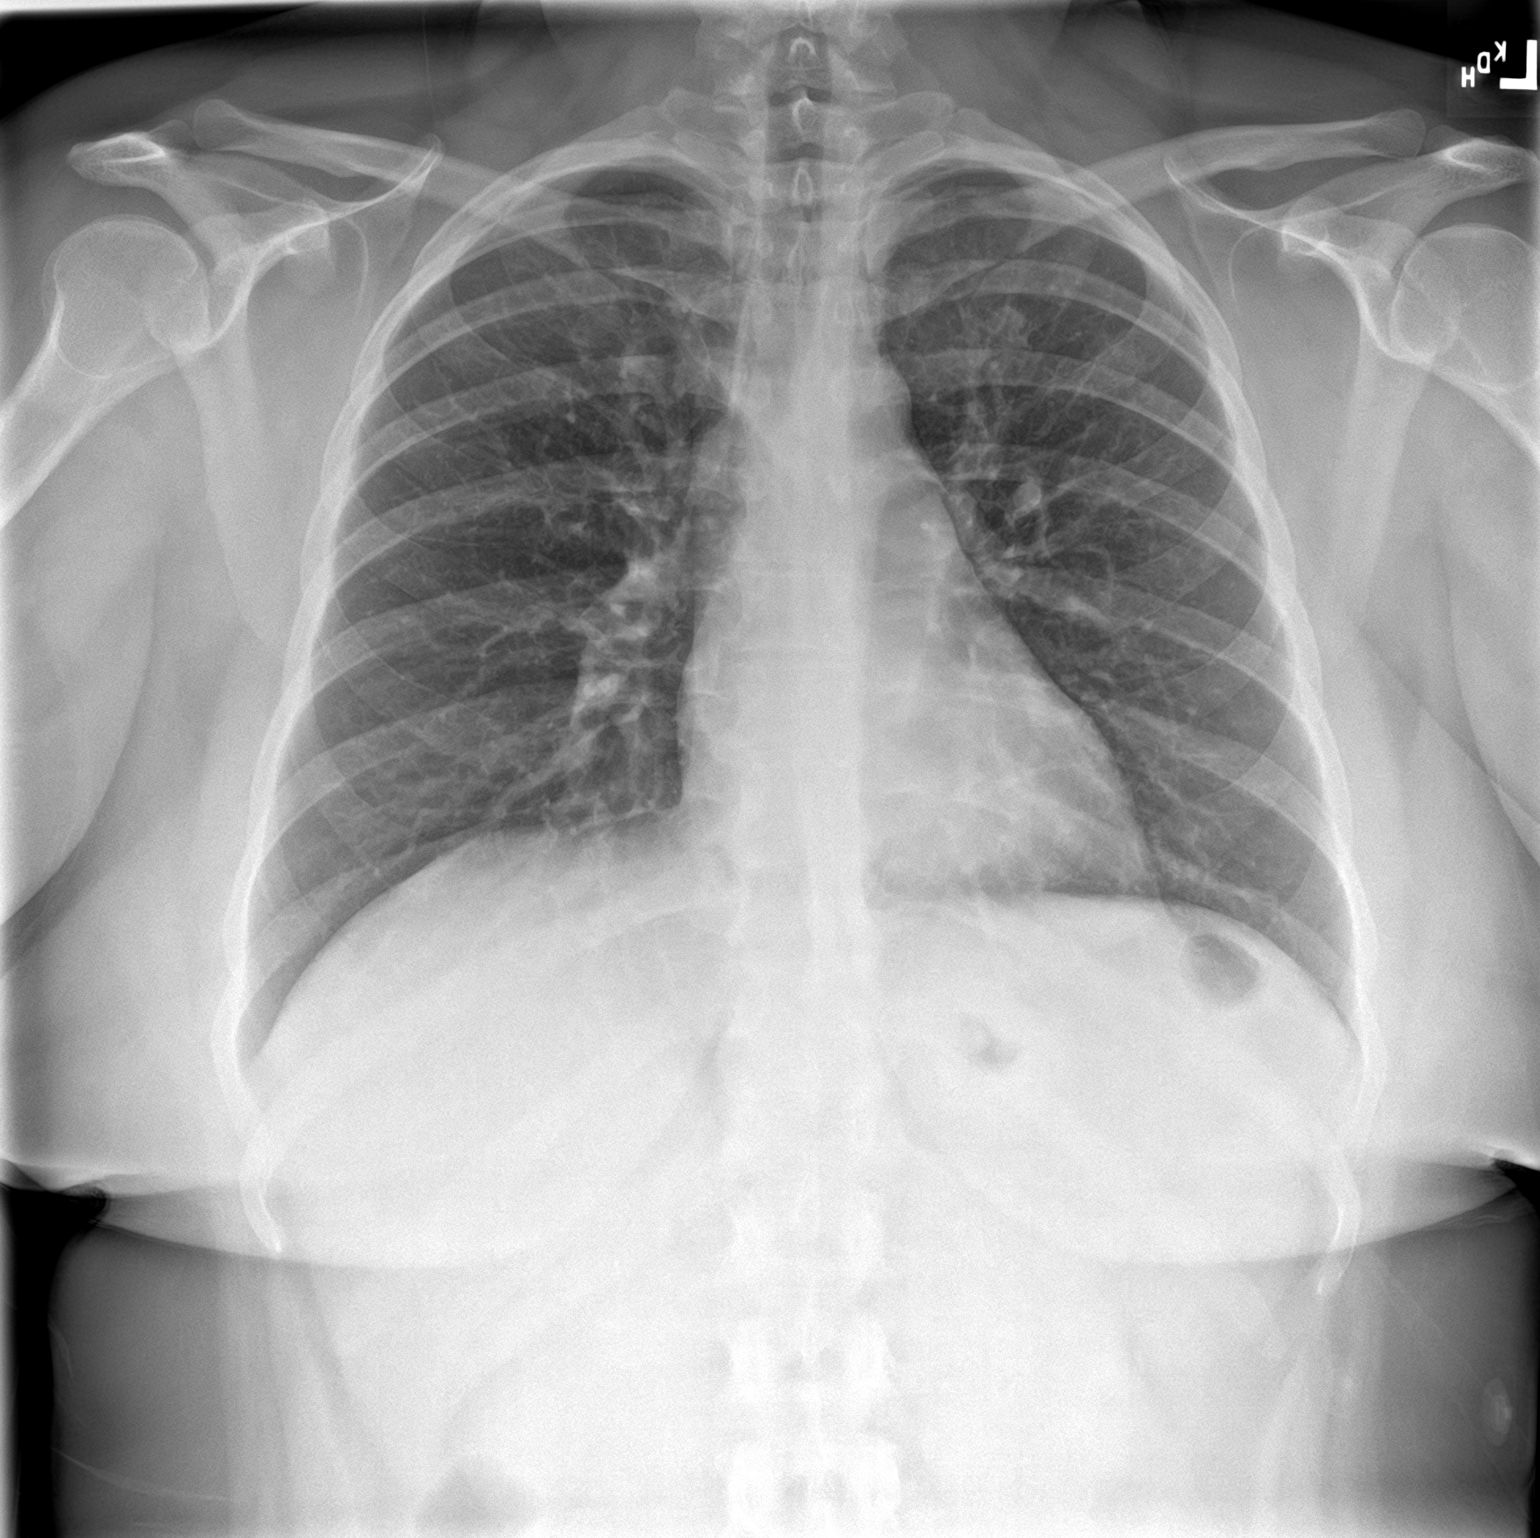

[chest lat]
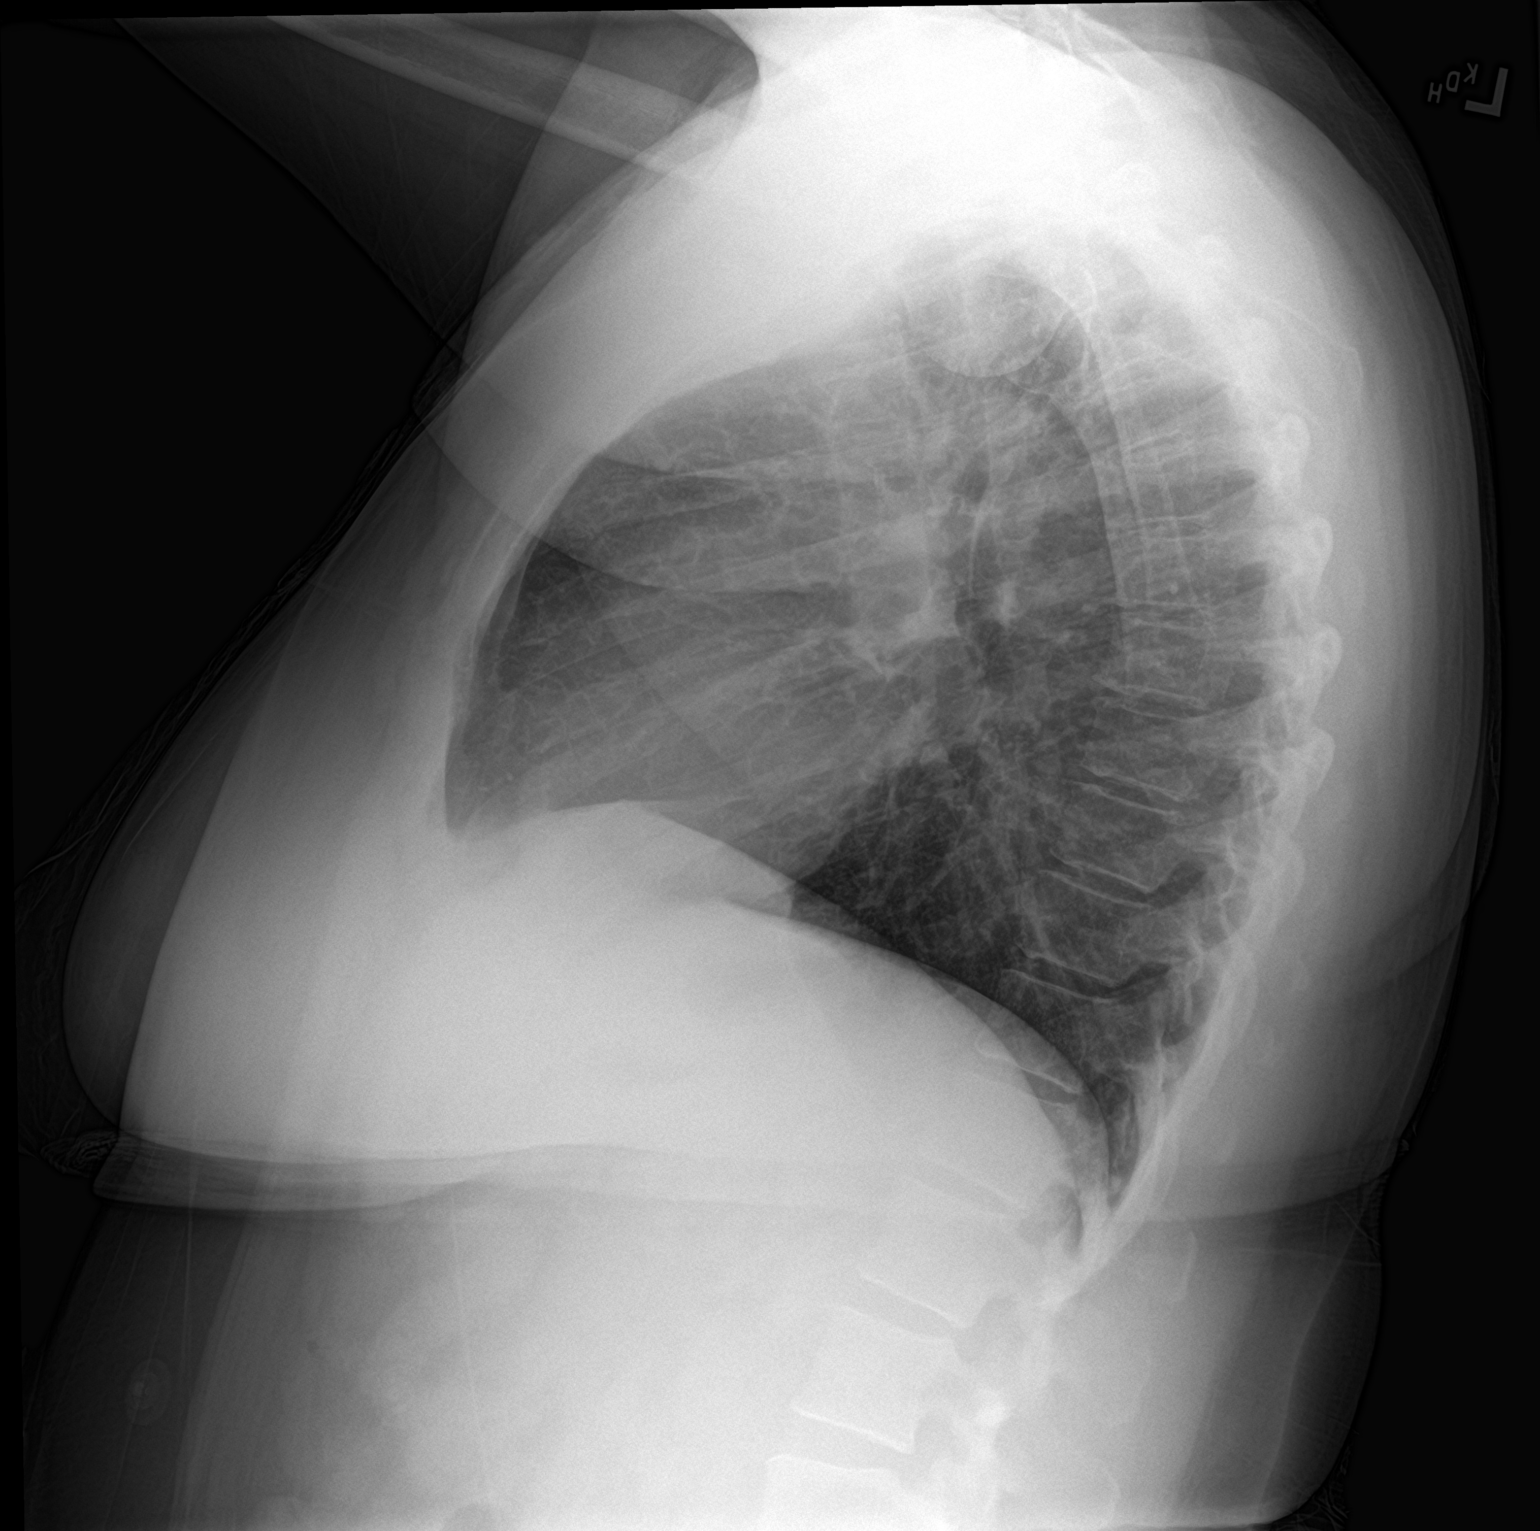

[2 of 2 positions shown; findings below may reference images not displayed]

FINDINGS: The heart size and mediastinal contours are within normal limits.
Both lungs are clear. No visible pleural effusions or pneumothorax.
No acute osseous abnormality.
IMPRESSION: No evidence of acute cardiopulmonary disease.

## 2022-01-12 ENCOUNTER — Other Ambulatory Visit: Payer: Self-pay

## 2022-01-12 ENCOUNTER — Encounter: Payer: Self-pay | Admitting: Emergency Medicine

## 2022-01-12 ENCOUNTER — Emergency Department
Admission: EM | Admit: 2022-01-12 | Discharge: 2022-01-12 | Disposition: A | Discharge status: Home / Self Care | Payer: 59 | Attending: Emergency Medicine | Admitting: Emergency Medicine

## 2022-01-12 DIAGNOSIS — W5503XA Scratched by cat, initial encounter: Secondary | ICD-10-CM

## 2022-01-12 DIAGNOSIS — S00212A Abrasion of left eyelid and periocular area, initial encounter: Secondary | ICD-10-CM | POA: Insufficient documentation

## 2022-01-12 DIAGNOSIS — Y92019 Unspecified place in single-family (private) house as the place of occurrence of the external cause: Secondary | ICD-10-CM | POA: Insufficient documentation

## 2022-01-12 DIAGNOSIS — S0993XA Unspecified injury of face, initial encounter: Secondary | ICD-10-CM | POA: Diagnosis present

## 2022-01-12 DIAGNOSIS — Z23 Encounter for immunization: Secondary | ICD-10-CM | POA: Insufficient documentation

## 2022-01-12 DIAGNOSIS — W5501XA Bitten by cat, initial encounter: Secondary | ICD-10-CM | POA: Insufficient documentation

## 2022-01-12 MED ORDER — TETANUS-DIPHTH-ACELL PERTUSSIS 5-2.5-18.5 LF-MCG/0.5 IM SUSY
0.5000 mL | PREFILLED_SYRINGE | Freq: Once | INTRAMUSCULAR | Status: AC
  Administered 2022-01-12: 0.5 mL via INTRAMUSCULAR
  Filled 2022-01-12: qty 0.5

## 2022-01-12 MED ORDER — AMOXICILLIN-POT CLAVULANATE 875-125 MG PO TABS: 1.0000 | ORAL_TABLET | Freq: Two times a day (BID) | ORAL | 0 refills | Status: AC

## 2022-01-12 NOTE — ED Provider Notes (Signed)
[facility] Provider Note    Event Date/Time   First MD Initiated Contact with Patient 01/12/22 0932     (approximate)   History   Animal Bite   HPI  Sara Wolf is a 36 y.o. female   presents to the ED with complaint of cat scratch above her left eyelid.  Patient states that it is her cat that stays in the house and rabies vaccination is up-to-date.  Patient believes that when she fell asleep on the couch that something startled the cat and prior to running off scratched her across her face.  Patient denies any eye pain, light sensitivity or vision changes.  Patient is not up-to-date on immunizations.      Physical Exam   Triage Vital Signs: ED Triage Vitals  Enc Vitals Group     BP 01/12/22 0842 (!) 149/83     Pulse Rate 01/12/22 0842 81     Resp 01/12/22 0842 18     Temp 01/12/22 0842 98.3 F (36.8 C)     Temp src --      SpO2 01/12/22 0842 97 %     Weight 01/12/22 0918 235 lb 0.2 oz (106.6 kg)     Height 01/12/22 0918 5\' 2"  (1.575 m)     Head Circumference --      Peak Flow --      Pain Score 01/12/22 0841 3     Pain Loc --      Pain Edu? --      Excl. in [place]? --     Most recent vital signs: Vitals:   01/12/22 0842  BP: (!) 149/83  Pulse: 81  Resp: 18  Temp: 98.3 F (36.8 C)  SpO2: 97%     General: Awake, no distress.  CV:  Good peripheral perfusion.  Resp:  Normal effort.  Abd:  No distention.  Other:  Just below the left eyebrow there is a superficial linear laceration measuring 1 cm approximately without active bleeding or foreign body present.  Conjunctival clear, PERRLA, EOMI's.   ED Results / Procedures / Treatments   Labs (all labs ordered are listed, but only abnormal results are displayed) Labs Reviewed - No data to display    PROCEDURES:  Critical Care performed:   Procedures   MEDICATIONS ORDERED IN ED: Medications  Tdap (BOOSTRIX) injection 0.5 mL (0.5 mLs Intramuscular Given 01/12/22 1036)      IMPRESSION / MDM / ASSESSMENT AND PLAN / ED COURSE  I reviewed the triage vital signs and the nursing notes.   Differential diagnosis includes, but is not limited to, cat scratch face, encounter for tetanus immunization.  36 year old female presents to the ED with a scratch to her left upper eyelid that occurred this morning when her cat was startled and ran while patient was sleeping on the couch.  Cat is an indoor cat and belongs to the patient.  Rabies immunizations are up-to-date.  Tdap was given to the patient while in the ED and area was cleaned with normal saline and Steri-Stripped.  I discussed possibility of infection with patient and she is aware that she can remove the Steri-Strip if needed to allow drainage.  Patient was placed on Augmentin 875 twice daily for 7 days.  She is return to the emergency department if any urgent concerns, worsening, fever, chills or concerns for infection.      Patient's presentation is most consistent with acute complicated illness / injury requiring diagnostic workup.  FINAL  CLINICAL IMPRESSION(S) / ED DIAGNOSES   Final diagnoses:  Cat scratch of face, initial encounter     Rx / DC Orders   ED Discharge Orders          Ordered    amoxicillin-clavulanate (AUGMENTIN) 875-125 MG tablet  2 times daily        01/12/22 1032             Note:  This document was prepared using Dragon voice recognition software and may include unintentional dictation errors.   Tommi Rumps, PA-C 01/12/22 1156    Phineas Semen, MD 01/12/22 1300

## 2022-01-12 NOTE — Discharge Instructions (Signed)
Begin taking antibiotics until completely finished and watch the area for any signs of infection.  Steri-Strips were applied to your scratch today and if there is any drainage you can loosen the Steri-Strips to allow drainage.  Return to the emergency department if any severe worsening of your symptoms especially if you begin running fever or increased swelling around the eye.  Tetanus booster was updated today.  You may take Tylenol with this medication if needed for pain.

## 2022-01-12 NOTE — ED Triage Notes (Signed)
Pt comes with c/o cat scratch to left eyelid. Pt states house is inside cat. Rabies is up to date. Pt has bleeding controlled.

## 2022-01-12 NOTE — ED Notes (Signed)
This nurse cleaned the laceration above the left eye with normal saline and guaze at this time.

## 2022-03-28 ENCOUNTER — Encounter: Payer: Self-pay | Admitting: Emergency Medicine

## 2022-03-28 ENCOUNTER — Emergency Department
Admission: EM | Admit: 2022-03-28 | Discharge: 2022-03-28 | Disposition: A | Discharge status: Home / Self Care | Payer: 59 | Attending: Emergency Medicine | Admitting: Emergency Medicine

## 2022-03-28 ENCOUNTER — Other Ambulatory Visit: Payer: Self-pay

## 2022-03-28 DIAGNOSIS — Z79899 Other long term (current) drug therapy: Secondary | ICD-10-CM | POA: Insufficient documentation

## 2022-03-28 DIAGNOSIS — J302 Other seasonal allergic rhinitis: Secondary | ICD-10-CM

## 2022-03-28 DIAGNOSIS — J45909 Unspecified asthma, uncomplicated: Secondary | ICD-10-CM | POA: Diagnosis not present

## 2022-03-28 DIAGNOSIS — H9201 Otalgia, right ear: Secondary | ICD-10-CM | POA: Diagnosis present

## 2022-03-28 MED ORDER — DEXAMETHASONE 6 MG PO TABS
10.0000 mg | ORAL_TABLET | Freq: Once | ORAL | Status: DC
  Filled 2022-03-28: qty 1

## 2022-03-28 MED ORDER — DEXAMETHASONE 10 MG/ML FOR PEDIATRIC ORAL USE
10.0000 mg | Freq: Once | INTRAMUSCULAR | Status: AC
  Administered 2022-03-28: 10 mg via ORAL
  Filled 2022-03-28: qty 1

## 2022-03-28 MED ORDER — FLUTICASONE PROPIONATE 50 MCG/ACT NA SUSP: 1.0000 | Freq: Every day | NASAL | 1 refills | Status: AC

## 2022-03-28 NOTE — Discharge Instructions (Addendum)
You may alternate Tylenol 1000 mg every 6 hours as needed for pain, fever and Ibuprofen 800 mg every 6-8 hours as needed for pain, fever.  Please take Ibuprofen with food.  Do not take more than 4000 mg of Tylenol (acetaminophen) in a 24 hour period.  Please do your Zyrtec and Singulair as prescribed.  You may take Benadryl 50 mg at night to help with your congestion and ear pain.

## 2022-03-28 NOTE — ED Triage Notes (Signed)
Pt arrived via POV with c/o R ear pain since yesterday, c/o muffled hearing.

## 2022-03-28 NOTE — ED Provider Notes (Signed)
[facility] Provider Note    Event Date/Time   First MD Initiated Contact with Patient 03/28/22 [phone]     (approximate)   History   Otalgia   HPI  Sara Wolf is a 37 y.o. female with history of asthma who presents to the emergency department complaints of right ear pain.  States it started off as a dull pain yesterday and now feels like a fullness.  Took 800 mg of ibuprofen prior to arrival.  Has had some chills today and sore throat last week.  No known fevers.  No cough.  States that she has been taking Zyrtec and Singulair because she feels like her seasonal allergies are acting up.   History provided by patient.    Past Medical History:  Diagnosis Date   Asthma    Complication of anesthesia    'took me a while to wake up'   History of pre-eclampsia    Obesity (BMI 30-39.9)    Pregnancy induced hypertension     Past Surgical History:  Procedure Laterality Date   BREAST REDUCTION SURGERY      MEDICATIONS:  Prior to Admission medications   Medication Sig Start Date End Date Taking? Authorizing Provider  fluticasone (FLONASE) 50 MCG/ACT nasal spray Place 1 spray into both nostrils daily. 03/28/22 03/28/23 Yes Aniza Shor, [name], DO  acetaminophen (TYLENOL) 325 MG tablet Take 2 tablets (650 mg total) by mouth every 4 (four) hours as needed (for pain scale < 4). 11/18/19   [name], MD  albuterol (PROVENTIL) (2.5 MG/3ML) 0.083% nebulizer solution Take 3 mLs (2.5 mg total) by nebulization every 6 (six) hours as needed for wheezing or shortness of breath. 09/03/19   Fisher, [name], PA-C  albuterol (VENTOLIN HFA) 108 (90 Base) MCG/ACT inhaler Inhale 2 puffs into the lungs every 6 (six) hours as needed for wheezing or shortness of breath.     [provider]  amoxicillin-clavulanate (AUGMENTIN) 875-125 MG tablet Take 1 tablet by mouth 2 (two) times daily. 01/12/22   [name], PA-C  Fluticasone-Salmeterol (ADVAIR) 250-50  MCG/DOSE AEPB Inhale 1 puff into the lungs 2 (two) times daily.    [provider]  ibuprofen (ADVIL) 600 MG tablet Take 1 tablet (600 mg total) by mouth every 6 (six) hours. 11/18/19   [name], MD  labetalol (NORMODYNE) 100 MG tablet Take 100 mg by mouth 2 (two) times daily. 10/22/19   [provider]  Prenatal Vit-Fe Fumarate-FA (PRENATAL MULTIVITAMIN) TABS tablet Take 1 tablet by mouth daily.     [provider]    Physical Exam   Triage Vital Signs: ED Triage Vitals  Enc Vitals Group     BP 03/28/22 0436 (!) 149/93     Pulse Rate 03/28/22 0436 (!) 125     Resp 03/28/22 0436 18     Temp 03/28/22 0436 98.2 F (36.8 C)     Temp Source 03/28/22 0436 Oral     SpO2 03/28/22 0436 98 %     Weight 03/28/22 0439 220 lb (99.8 kg)     Height 03/28/22 0439 '5\' 2"'$  (1.575 m)     Head Circumference --      Peak Flow --      Pain Score 03/28/22 0449 5     Pain Loc --      Pain Edu? --      Excl. in [place]? --     Most recent vital signs: Vitals:   03/28/22 0436  BP: (!) 149/93  Pulse: (!) 125  Resp: 18  Temp: 98.2 F (36.8 C)  SpO2: 98%    CONSTITUTIONAL: Alert, responds appropriately to questions. Well-appearing; well-nourished HEAD: Normocephalic, atraumatic EYES: Conjunctivae clear, pupils appear equal, sclera nonicteric ENT: normal nose; moist mucous membranes; No pharyngeal erythema or petechiae, no tonsillar hypertrophy or exudate, no uvular deviation, no unilateral swelling in posterior oropharynx, no trismus or drooling, no muffled voice, normal phonation, no stridor, airway patent.  TMs are clear bilaterally without erythema, purulence, bulging, perforation, effusion.  No cerumen impaction or sign of foreign body in the external auditory canal. No inflammation, erythema or drainage from the external auditory canal. No signs of mastoiditis. No pain with manipulation of the pinna bilaterally. NECK: Supple, normal ROM CARD: RRR; S1 and S2  appreciated RESP: Normal chest excursion without splinting or tachypnea; breath sounds clear and equal bilaterally; no wheezes, no rhonchi, no rales, no hypoxia or respiratory distress, speaking full sentences ABD/GI: Non-distended; soft, non-tender, no rebound, no guarding, no peritoneal signs BACK: The back appears normal EXT: Normal ROM in all joints; no deformity noted, no edema SKIN: Normal color for age and race; warm; no rash on exposed skin NEURO: Moves all extremities equally, normal speech PSYCH: The patient's mood and manner are appropriate.   ED Results / Procedures / Treatments   LABS: (all labs ordered are listed, but only abnormal results are displayed) Labs Reviewed - No data to display   EKG:   RADIOLOGY: My personal review and interpretation of imaging:    I have personally reviewed all radiology reports.   No results found.   PROCEDURES:  Critical Care performed: No    Procedures    IMPRESSION / MDM / ASSESSMENT AND PLAN / ED COURSE  I reviewed the triage vital signs and the nursing notes.    Patient here with right-sided ear pain.  History of seasonal allergies, asthma.    DIFFERENTIAL DIAGNOSIS (includes but not limited to):   No signs of otitis media, otitis externa, mastoiditis on exam.  Offered flu and COVID testing which she declines.  Suspect symptoms due to seasonal allergies.  She is tachycardic here but states she is very anxious.   Patient's presentation is most consistent with acute, uncomplicated illness.   PLAN: Will give dose of Decadron for symptomatic relief.  Have offered Tylenol for pain control which she declines.  No indication for antibiotics today.  She is tachycardic but states she is very nervous.  Will monitor this and encourage fluids.  Have offered COVID and flu testing today which she declines.   MEDICATIONS GIVEN IN ED: Medications  dexamethasone (DECADRON) tablet 10 mg (has no administration in time range)      ED COURSE: Patient's heart rate has improved.  Tolerating p.o.  Will discharge home.  Discussed supportive care instructions.  Will discharge with Flonase.  Recommended Benadryl at night in addition to Singulair and Zyrtec in the morning.   At this time, I do not feel there is any life-threatening condition present. I reviewed all nursing notes, vitals, pertinent previous records.  All lab and urine results, EKGs, imaging ordered have been independently reviewed and interpreted by myself.  I reviewed all available radiology reports from any imaging ordered this visit.  Based on my assessment, I feel the patient is safe to be discharged home without further emergent workup and can continue workup as an outpatient as needed. Discussed all findings, treatment plan as well as usual and customary return precautions.  They verbalize understanding and are comfortable with this plan.  Outpatient follow-up has been provided as needed.  All questions have been answered.    CONSULTS:  none   OUTSIDE RECORDS REVIEWED: Reviewed last office visit with Roddie Mc on 04/23/2021 for tympanic membrane rupture on the right side with otitis media.       FINAL CLINICAL IMPRESSION(S) / ED DIAGNOSES   Final diagnoses:  Acute otalgia, right  Seasonal allergies     Rx / DC Orders   ED Discharge Orders          Ordered    fluticasone (FLONASE) 50 MCG/ACT nasal spray  Daily        03/28/22 0502             Note:  This document was prepared using Dragon voice recognition software and may include unintentional dictation errors.   Jenin Birdsall, [name], DO 03/28/22 [phone]

## 2022-04-04 ENCOUNTER — Ambulatory Visit
Admission: EM | Admit: 2022-04-04 | Discharge: 2022-04-04 | Disposition: A | Discharge status: Home / Self Care | Payer: 59 | Attending: Urgent Care | Admitting: Urgent Care

## 2022-04-04 ENCOUNTER — Encounter: Payer: Self-pay | Admitting: Emergency Medicine

## 2022-04-04 DIAGNOSIS — R3915 Urgency of urination: Secondary | ICD-10-CM | POA: Insufficient documentation

## 2022-04-04 LAB — POCT URINALYSIS DIP (MANUAL ENTRY)
Glucose, UA: NEGATIVE mg/dL
Leukocytes, UA: NEGATIVE
Nitrite, UA: NEGATIVE
Protein Ur, POC: 30 mg/dL — AB
Spec Grav, UA: >=1.03 — AB (ref 1.010–1.025)
Urobilinogen, UA: 1 E.U./dL
pH, UA: 6 (ref 5.0–8.0)

## 2022-04-04 NOTE — ED Triage Notes (Signed)
Pt states since this morning she has urinary urgency, but is not producing a lot if urine. She also has lower abdominal pain.

## 2022-04-04 NOTE — ED Provider Notes (Signed)
[name]    CSN: [id number] Arrival date & time: 04/04/22  0910      History   Chief Complaint Chief Complaint  Patient presents with   Urinary Retention    Have been on penicillin since Monday for tooth - Entered by patient   Urinary urgency     HPI Sara Wolf is a 37 y.o. female.   HPI  Patient presents with concern for urinary frequency without production of significant urine.  Endorses lower abdominal pain.  He endorses drinking adequate water.  Past Medical History:  Diagnosis Date   Asthma    Complication of anesthesia    'took me a while to wake up'   History of pre-eclampsia    Obesity (BMI 30-39.9)    Pregnancy induced hypertension     Patient Active Problem List   Diagnosis Date Noted   Chronic pre-existing hypertension during pregnancy 11/17/2019   NSVD (normal spontaneous vaginal delivery) 11/17/2019   Sepsis ([place]) 08/20/2018   Severe preeclampsia, third trimester 08/15/2018   Severe preeclampsia 08/14/2018    Past Surgical History:  Procedure Laterality Date   BREAST REDUCTION SURGERY      OB History     Gravida  2   Para  2   Term  1   Preterm  1   AB      Living  2      SAB      IAB      Ectopic      Multiple  0   Live Births  2            Home Medications    Prior to Admission medications   Medication Sig Start Date End Date Taking? Authorizing Provider  amLODipine (NORVASC) 5 MG tablet Take by mouth. 03/31/22  Yes [provider]  FLUoxetine (PROZAC) 10 MG capsule Take 10 mg by mouth daily. 03/26/22  Yes [provider]  penicillin v potassium (VEETID) 500 MG tablet Take 500 mg by mouth every 6 (six) hours. 03/29/22  Yes [provider]  acetaminophen (TYLENOL) 325 MG tablet Take 2 tablets (650 mg total) by mouth every 4 (four) hours as needed (for pain scale < 4). 11/18/19   [name], MD  albuterol (PROVENTIL) (2.5 MG/3ML) 0.083% nebulizer solution Take 3 mLs  (2.5 mg total) by nebulization every 6 (six) hours as needed for wheezing or shortness of breath. 09/03/19   Fisher, [name], PA-C  albuterol (VENTOLIN HFA) 108 (90 Base) MCG/ACT inhaler Inhale 2 puffs into the lungs every 6 (six) hours as needed for wheezing or shortness of breath.     [provider]  amoxicillin-clavulanate (AUGMENTIN) 875-125 MG tablet Take 1 tablet by mouth 2 (two) times daily. 01/12/22   [name], PA-C  buPROPion (WELLBUTRIN SR) 150 MG 12 hr tablet Take 150 mg by mouth every morning.    [provider]  fluticasone (FLONASE) 50 MCG/ACT nasal spray Place 1 spray into both nostrils daily. 03/28/22 03/28/23  Ward, [name], DO  Fluticasone-Salmeterol (ADVAIR) 250-50 MCG/DOSE AEPB Inhale 1 puff into the lungs 2 (two) times daily.    [provider]  ibuprofen (ADVIL) 600 MG tablet Take 1 tablet (600 mg total) by mouth every 6 (six) hours. 11/18/19   [name], MD  labetalol (NORMODYNE) 100 MG tablet Take 100 mg by mouth 2 (two) times daily. 10/22/19   [provider]  Prenatal Vit-Fe Fumarate-FA (PRENATAL MULTIVITAMIN) TABS tablet Take 1 tablet  by mouth daily.     [provider]    Family History Family History  Problem Relation Age of Onset   Healthy Mother    Hypertension Father    Asthma Father    Hyperlipidemia Father    Stroke Maternal Grandmother    Diabetes Maternal Grandfather    Cancer Paternal Grandfather     Social History Social History   Tobacco Use   Smoking status: Former   Smokeless tobacco: Never  Scientific laboratory technician Use: Never used  Substance Use Topics   Alcohol use: Not Currently   Drug use: Never     Allergies   Apple juice, Peach [prunus persica], and Pear   Review of Systems Review of Systems   Physical Exam Triage Vital Signs ED Triage Vitals  Enc Vitals Group     BP 04/04/22 0935 133/89     Pulse Rate 04/04/22 0935 (!) 102     Resp 04/04/22 0935 16     Temp  04/04/22 0935 98.6 F (37 C)     Temp Source 04/04/22 0935 Oral     SpO2 04/04/22 0935 96 %     Weight --      Height --      Head Circumference --      Peak Flow --      Pain Score 04/04/22 0936 0     Pain Loc --      Pain Edu? --      Excl. in [place]? --    No data found.  Updated Vital Signs BP 133/89 (BP Location: Left Arm)   Pulse (!) 102   Temp 98.6 F (37 C) (Oral)   Resp 16   LMP 03/20/2022 (Exact Date)   SpO2 96%   Visual Acuity Right Eye Distance:   Left Eye Distance:   Bilateral Distance:    Right Eye Near:   Left Eye Near:    Bilateral Near:     Physical Exam Vitals reviewed.  Constitutional:      Appearance: Normal appearance.  Abdominal:     Palpations: There is no mass.     Tenderness: There is no abdominal tenderness.     Comments: Unable to palpate bladder though patient endorses bladder pressure when I do.  No suprapubic pain or discomfort.  Skin:    General: Skin is warm and dry.  Neurological:     General: No focal deficit present.     Mental Status: She is alert and oriented to person, place, and time.  Psychiatric:        Mood and Affect: Mood normal.        Behavior: Behavior normal.      UC Treatments / Results  Labs (all labs ordered are listed, but only abnormal results are displayed) Labs Reviewed  POCT URINALYSIS DIP (MANUAL ENTRY) - Abnormal; Notable for the following components:      Result Value   Clarity, UA cloudy (*)    Bilirubin, UA small (*)    Ketones, POC UA small (15) (*)    Spec Grav, UA >=1.030 (*)    Blood, UA trace-lysed (*)    Protein Ur, POC =30 (*)    All other components within normal limits    EKG   Radiology No results found.  Procedures Procedures (including critical care time)  Medications Ordered in UC Medications - No data to display  Initial Impression / Assessment and Plan / UC Course  I have reviewed  the triage vital signs and the nursing notes.  Pertinent labs & imaging results that  were available during my care of the patient were reviewed by me and considered in my medical decision making (see chart for details).   UA is not indicative of urinary tract infection.  No leukocytes or nitrites.  Her bladder is nonpalpable.  She has no suprapubic discomfort with palpation.  Specific gravity implies dehydration and recommended patient increase water intake.   Final Clinical Impressions(s) / UC Diagnoses   Final diagnoses:  None   Discharge Instructions   None    ED Prescriptions   None    PDMP not reviewed this encounter.   Rose Phi, [place] 04/04/22 1030

## 2022-04-04 NOTE — Discharge Instructions (Signed)
Follow up here or with your primary care provider if your symptoms are worsening or not improving.    

## 2022-04-06 ENCOUNTER — Telehealth (HOSPITAL_COMMUNITY): Payer: Self-pay | Admitting: Emergency Medicine

## 2022-04-06 LAB — URINE CULTURE: Culture: 30000 — AB

## 2022-04-06 MED ORDER — SULFAMETHOXAZOLE-TRIMETHOPRIM 800-160 MG PO TABS: 1.0000 | ORAL_TABLET | Freq: Two times a day (BID) | ORAL | 0 refills | Status: AC
